# Patient Record
Sex: Female | Born: 1950
Health system: Southern US, Community
[De-identification: ages and names within clinical notes are randomized; demographics above are authoritative.]

## PROBLEM LIST (undated history)

## (undated) DIAGNOSIS — M5136 Other intervertebral disc degeneration, lumbar region: Secondary | ICD-10-CM

## (undated) DIAGNOSIS — M51369 Other intervertebral disc degeneration, lumbar region without mention of lumbar back pain or lower extremity pain: Secondary | ICD-10-CM

## (undated) DIAGNOSIS — T7840XA Allergy, unspecified, initial encounter: Secondary | ICD-10-CM

## (undated) HISTORY — DX: Other intervertebral disc degeneration, lumbar region: M51.36

## (undated) HISTORY — DX: Allergy, unspecified, initial encounter: T78.40XA

## (undated) HISTORY — PX: UTERINE FIBROID SURGERY: SHX826

## (undated) HISTORY — DX: Other intervertebral disc degeneration, lumbar region without mention of lumbar back pain or lower extremity pain: M51.369

---

## 2007-10-27 ENCOUNTER — Emergency Department (HOSPITAL_COMMUNITY): Admission: EM | Admit: 2007-10-27 | Discharge: 2007-10-27 | Payer: Self-pay | Admitting: Family Medicine

## 2008-10-14 ENCOUNTER — Emergency Department (HOSPITAL_COMMUNITY): Admission: EM | Admit: 2008-10-14 | Discharge: 2008-10-14 | Payer: Self-pay | Admitting: Emergency Medicine

## 2008-10-29 ENCOUNTER — Encounter: Payer: Self-pay | Admitting: Cardiology

## 2008-10-29 ENCOUNTER — Encounter (INDEPENDENT_AMBULATORY_CARE_PROVIDER_SITE_OTHER): Payer: Self-pay | Admitting: Diagnostic Neuroimaging

## 2008-10-29 ENCOUNTER — Ambulatory Visit: Payer: Self-pay

## 2010-05-23 LAB — POCT I-STAT, CHEM 8
Calcium, Ion: 1.24 mmol/L (ref 1.12–1.32)
Glucose, Bld: 90 mg/dL (ref 70–99)
HCT: 47 % — ABNORMAL HIGH (ref 36.0–46.0)
Hemoglobin: 16 g/dL — ABNORMAL HIGH (ref 12.0–15.0)
Potassium: 3.7 mEq/L (ref 3.5–5.1)

## 2010-11-18 LAB — GC/CHLAMYDIA PROBE AMP, GENITAL: Chlamydia, DNA Probe: NEGATIVE

## 2010-11-18 LAB — POCT URINALYSIS DIP (DEVICE)
Nitrite: NEGATIVE
Operator id: 247071
Protein, ur: NEGATIVE
pH: 5

## 2010-11-18 LAB — WET PREP, GENITAL: Yeast Wet Prep HPF POC: NONE SEEN

## 2010-11-18 LAB — URINE CULTURE

## 2015-06-16 DIAGNOSIS — Z0100 Encounter for examination of eyes and vision without abnormal findings: Secondary | ICD-10-CM | POA: Diagnosis not present

## 2015-07-10 ENCOUNTER — Ambulatory Visit (HOSPITAL_BASED_OUTPATIENT_CLINIC_OR_DEPARTMENT_OTHER)
Admission: RE | Admit: 2015-07-10 | Discharge: 2015-07-10 | Disposition: A | Discharge status: Home / Self Care | Payer: BLUE CROSS/BLUE SHIELD | Source: Ambulatory Visit | Attending: Physician Assistant | Admitting: Physician Assistant

## 2015-07-10 ENCOUNTER — Telehealth: Payer: Self-pay | Admitting: *Deleted

## 2015-07-10 ENCOUNTER — Ambulatory Visit (INDEPENDENT_AMBULATORY_CARE_PROVIDER_SITE_OTHER): Payer: BLUE CROSS/BLUE SHIELD | Admitting: Physician Assistant

## 2015-07-10 ENCOUNTER — Ambulatory Visit (INDEPENDENT_AMBULATORY_CARE_PROVIDER_SITE_OTHER): Payer: BLUE CROSS/BLUE SHIELD

## 2015-07-10 VITALS — BP 146/92 | HR 84 | Temp 97.7°F | Resp 18 | Ht 63.5 in | Wt 181.0 lb

## 2015-07-10 DIAGNOSIS — M5136 Other intervertebral disc degeneration, lumbar region: Secondary | ICD-10-CM | POA: Diagnosis not present

## 2015-07-10 DIAGNOSIS — R202 Paresthesia of skin: Secondary | ICD-10-CM | POA: Diagnosis not present

## 2015-07-10 DIAGNOSIS — M545 Low back pain: Secondary | ICD-10-CM

## 2015-07-10 DIAGNOSIS — M51369 Other intervertebral disc degeneration, lumbar region without mention of lumbar back pain or lower extremity pain: Secondary | ICD-10-CM

## 2015-07-10 HISTORY — DX: Other intervertebral disc degeneration, lumbar region without mention of lumbar back pain or lower extremity pain: M51.369

## 2015-07-10 HISTORY — DX: Other intervertebral disc degeneration, lumbar region: M51.36

## 2015-07-10 MED ORDER — MELOXICAM 15 MG PO TABS: 15.0000 mg | ORAL_TABLET | Freq: Every day | ORAL | Status: DC

## 2015-07-10 NOTE — Patient Instructions (Addendum)
You are to go over to [facility] now for you CT scan.  Address: [address], [place], [place] [address]  Phone: [phone]     IF you received an x-ray today, you will receive an invoice from [facility]. Please contact [facility] at [phone] with questions or concerns regarding your invoice.   IF you received labwork today, you will receive an invoice from [facility]. Please contact [facility] at [phone] with questions or concerns regarding your invoice.   Our billing staff will not be able to assist you with questions regarding bills from these companies.  You will be contacted with the lab results as soon as they are available. The fastest way to get your results is to activate your My Chart account. Instructions are located on the last page of this paperwork. If you have not heard from [place] regarding the results in 2 weeks, please contact this office.

## 2015-07-10 NOTE — Telephone Encounter (Signed)
Called patient and told her her results. Verbalized understanding.

## 2015-07-10 NOTE — Telephone Encounter (Signed)
CT Head results in EPIC.  Patient was let go and she was advised someone would call her.

## 2015-07-10 NOTE — Progress Notes (Signed)
Patient ID: Jennifer Hobbs, female     DOB: February 19, 1950, 65 y.o.    MRN: [id number]  PCP: No primary care provider on file.  Chief Complaint  Patient presents with  . Neck Pain    right side, radiates down leg, x 2 weeks    Subjective:    HPI  Presents for evaluation of RIGHT sided neck pain.  About 5 years ago, she developed pain on the RIGHT side of her neck and in the RIGHT arm, associated with numbness in the arm. Was in the process of having it checked out, had a CT scan and an MRI, and another study she doesn't recall the name of.  CT 10/14/2008: Findings: Ventricular size and CSF spaces within normal limits. No evidence for acute infarct, hemorrhage, or mass lesion. No extra- axial fluid collections or midline shift. Calvarium intact. No fluid in the sinuses visualized.  Note that acute ischemic changes may be occult on CT in the first 24-48 hours.  IMPRESSION:  1. No acute or focal cranial abnormality at this time - see above Discussion.  MRI 10/14/2008: Findings: There is no evidence for acute infarction, intracranial hemorrhage, mass lesion, hydrocephalus, or extra-axial fluid. There is no atrophy, or significant white matter disease. Mildly prominent perivascular spaces suggest chronic hypertension. Wide patency of the carotid and basilar arteries is established with flow void signal. There is no significant sinus or mastoid disease. Extracranial soft tissues grossly unremarkable. Pituitary and cerebellar tonsils within normal limits. Mild cervical spondylosis.  IMPRESSION: No acute intracranial findings. Specifically no evidence for stroke or mass which might result in left-sided numbness.   She returned for her follow-up visit but her appointment had been cancelled, and has not rescheduled. The pain resolved, and the only residual was a "lump right here on my neck" (points to the supraclavicular space), until 2 weeks ago.  She  called the neurology office to reschedule and was advised that due to the time lapse, she needed a new referral.  Her grandson, who is 13, stayed with her for spring break, and she fell asleep curled on on the couch with him. She noted her pain upon waking. Initially the pain was in the low back, which has now resolved.  She now has pain in the "buttocks." Yesterday she noticed the recurrent neck pan as well. Reports numbness on the RIGHT side of the body, face, arm and leg this morning. "That little tingling feeling." It resolved except for the RIGHT jaw, which still feels "weird and tight."  Took ibuprofen, which helped for a day or two. Then tried naproxen, which "did all right for a little while, it eases."   Prior to Admission medications   Not on File     No Known Allergies   There are no active problems to display for this patient.    Family History  Problem Relation Age of Onset  . Diabetes Sister   . Stroke Brother   . Multiple sclerosis Daughter 19     Social History   Social History  . Marital Status: Married    Spouse Name: Geralynn Demchak  . Number of Children: 1  . Years of Education: college   Occupational History  . 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 Smoking status: Former Research scientist (life sciences)  . Smokeless tobacco: Never Used  . Alcohol Use: 0.0 - 1.2 oz/week    0-2 Standard drinks or equivalent per  week  . Drug Use: No  . Sexual Activity: Not on file   Other Topics Concern  . Not on file   Social History Narrative   Graduated from the [employer].   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   Lives with her husband.   Their daughter and grandson moved to [place], [place] 09/2013.         Review of Systems As above.      Objective:  Physical Exam  Constitutional: She is oriented  to person, place, and time. She appears well-developed and well-nourished. She is active and cooperative. No distress.  BP 146/92 mmHg  Pulse 84  Temp(Src) 97.7 F (36.5 C) (Oral)  Resp 18  Ht 5' 3.5" (1.613 m)  Wt 181 lb (82.101 kg)  BMI 31.56 kg/m2  SpO2 98%  HENT:  Head: Normocephalic and atraumatic.  Right Ear: Hearing normal.  Left Ear: Hearing normal.  Eyes: Conjunctivae are normal. No scleral icterus.  Neck: Normal range of motion. Neck supple. No thyromegaly present.  Cardiovascular: Normal rate, regular rhythm and normal heart sounds.   Pulses:      Radial pulses are 2+ on the right side, and 2+ on the left side.  Pulmonary/Chest: Effort normal and breath sounds normal.  Lymphadenopathy:       Head (right side): No tonsillar, no preauricular, no posterior auricular and no occipital adenopathy present.       Head (left side): No tonsillar, no preauricular, no posterior auricular and no occipital adenopathy present.    She has no cervical adenopathy.       Right: No supraclavicular adenopathy present.       Left: No supraclavicular adenopathy present.  Neurological: She is alert and oriented to person, place, and time. She has normal strength. A sensory deficit (decreased sensation on the lower jaw on the RIGHT) is present. No cranial nerve deficit. Coordination and gait normal.  Reflex Scores:      Bicep reflexes are 2+ on the right side and 2+ on the left side.      Patellar reflexes are 2+ on the right side and 2+ on the left side.      Achilles reflexes are 2+ on the right side and 2+ on the left side. Skin: Skin is warm, dry and intact. No rash noted. No cyanosis or erythema. Nails show no clubbing.  Psychiatric: She has a normal mood and affect. Her speech is normal and behavior is normal.        Dg Lumbar Spine Complete  07/10/2015  CLINICAL DATA:  Low back pain for 2 weeks.  No injury. EXAM: LUMBAR SPINE - COMPLETE 4+ VIEW COMPARISON:  None. FINDINGS: There is  no evidence of lumbar spine fracture. Alignment is normal except for trace anterolisthesis L2-3 Advanced disc space narrowing at L3-4, L4-5, and L5-S1. Vascular calcification. No worrisome osseous lesion. Marked endplate spurring. Lower lumbar facet arthropathy. IMPRESSION: Degenerative changes as described. Electronically Signed   By: Staci Righter M.D.   On: 07/10/2015 14:58        Assessment & Plan:  1. Bilateral low back pain, with sciatica presence unspecified DDD, l-spine. Symptomatic care. - DG Lumbar Spine Complete; Future - meloxicam (MOBIC) 15 MG tablet; Take 1 tablet (15 mg total) by mouth daily.  Dispense: 30 tablet; Refill: 1  2. Paresthesia Head CT now due to facial paresthesias  acutely today. Refer back to neurology for additional evaluation. - Ambulatory referral to Neurology - CT Head Wo Contrast; Future  3. Degenerative disc disease, lumbar - meloxicam (MOBIC) 15 MG tablet; Take 1 tablet (15 mg total) by mouth daily.  Dispense: 30 tablet; Refill: 1   Fara Chute, PA-C Physician Assistant-Certified [facility]

## 2015-07-21 ENCOUNTER — Ambulatory Visit (INDEPENDENT_AMBULATORY_CARE_PROVIDER_SITE_OTHER): Payer: BLUE CROSS/BLUE SHIELD | Admitting: Diagnostic Neuroimaging

## 2015-07-21 ENCOUNTER — Encounter: Payer: Self-pay | Admitting: Diagnostic Neuroimaging

## 2015-07-21 VITALS — BP 179/97 | HR 78 | Ht 65.0 in | Wt 181.2 lb

## 2015-07-21 DIAGNOSIS — R202 Paresthesia of skin: Secondary | ICD-10-CM

## 2015-07-21 DIAGNOSIS — I10 Essential (primary) hypertension: Secondary | ICD-10-CM

## 2015-07-21 DIAGNOSIS — R200 Anesthesia of skin: Secondary | ICD-10-CM

## 2015-07-21 MED ORDER — ASPIRIN EC 81 MG PO TBEC: 81.0000 mg | DELAYED_RELEASE_TABLET | Freq: Every day | ORAL | Status: AC

## 2015-07-21 NOTE — Patient Instructions (Signed)
Thank you for coming to see us at [facility]. I hope we have been able to provide you high quality care today.  You may receive a patient satisfaction survey over the next few weeks. We would appreciate your feedback and comments so that we may continue to improve ourselves and the health of our patients.  - I will check MRI brain and MRA head - I will check ultrasound of neck and heart - I will check lab testing - start aspirin 81mg daily - monitor blood pressures at home - establish with primary care physician   ~~~~~~~~~~~~~~~~~~~~~~~~~~~~~~~~~~~~~~~~~~~~~~~~~~~~~~~~~~~~~~~~~  DR. PENUMALLI'S GUIDE TO HAPPY AND HEALTHY LIVING These are some of my general health and wellness recommendations. Some of them may apply to you better than others. Please use common sense as you try these suggestions and feel free to ask me any questions.   ACTIVITY/FITNESS Mental, social, emotional and physical stimulation are very important for brain and body health. Try learning a new activity (arts, music, language, sports, games).  Keep moving your body to the best of your abilities. You can do this at home, inside or outside, the park, community center, gym or anywhere you like. Consider a physical therapist or personal trainer to get started. Consider the app Sworkit. Fitness trackers such as smart-watches, smart-phones or Fitbits can help as well.   NUTRITION Eat more plants: colorful vegetables, nuts, seeds and berries.  Eat less sugar, salt, preservatives and processed foods.  Avoid toxins such as cigarettes and alcohol.  Drink water when you are thirsty. Warm water with a slice of lemon is an excellent morning drink to start the day.  Consider these websites for more information The Nutrition Source (https://www.hsph.harvard.edu/nutritionsource) Precision Nutrition (www.precisionnutrition.com/blog/infographics)   RELAXATION Consider practicing mindfulness meditation or  other relaxation techniques such as deep breathing, prayer, yoga, tai chi, massage. See website mindful.org or the apps Headspace or Calm to help get started.   SLEEP Try to get at least 7-8+ hours sleep per day. Regular exercise and reduced caffeine will help you sleep better. Practice good sleep hygeine techniques. See website sleep.org for more information.   PLANNING Prepare estate planning, living will, healthcare POA documents. Sometimes this is best planned with the help of an attorney. Theconversationproject.org and agingwithdignity.org are excellent resources.  

## 2015-07-21 NOTE — Progress Notes (Signed)
[facility]  PATIENT: Jennifer Hobbs DOB: April 10, 1950  REFERRING CLINICIAN: Doroteo Bradford, PA HISTORY FROM: patient  REASON FOR VISIT: new consult   HISTORICAL  CHIEF COMPLAINT:  Chief Complaint  Patient presents with  . Paresthesia    rm 6, New Pt, grandson- [name], "numbness R side of face and extremities, tingly, sometimes goes down my extremities"    HISTORY OF PRESENT ILLNESS:   65 year old right-handed female here for evaluation of transient right-sided numbness. 2010 patient had right arm and right leg numbness lasting for one day. She had MRI of the brain at that time which was unremarkable. She had no further recurrence of symptoms until 07/10/2015. Patient felt numbness in the right face, right arm, right leg. Symptoms have been fluctuating. Symptoms may occur for a few seconds and then go away. Sometimes symptoms will last for a few minutes at a time. She's been having multiple episodes on a daily basis. During our conversation and examination, patient felt some numbness in the right face for a few seconds which then stopped.  Patient denies any slurred speech, weakness, problems with the left side, headaches, vision problems or balance difficulty. No specific triggering or acting factors. No recent traumas, infections, injuries.  Patient does not have a primary care physician. Meloxicam as needed for joint pain. Today blood pressure is 179/97. She does not routinely check her blood pressure at home.   REVIEW OF SYSTEMS: Full 14 system review of systems performed and negative with exception of: Numbness aching muscles. Degenerative lumbar spine disease.   ALLERGIES: No Known Allergies  HOME MEDICATIONS: Outpatient Prescriptions Prior to Visit  Medication Sig Dispense Refill  . meloxicam (MOBIC) 15 MG tablet Take 1 tablet (15 mg total) by mouth daily. 30 tablet 1   No facility-administered medications prior to visit.    PAST MEDICAL  HISTORY: Past Medical History  Diagnosis Date  . DDD (degenerative disc disease), lumbar     PAST SURGICAL HISTORY: Past Surgical History  Procedure Laterality Date  . Uterine fibroid surgery      FAMILY HISTORY: Family History  Problem Relation Age of Onset  . Diabetes Sister   . Stroke Brother   . Multiple sclerosis Daughter 74    SOCIAL HISTORY:  Social History   Social History  . Marital Status: Married    Spouse Name: Shiya Kesselring  . Number of Children: 1  . Years of Education: college   Occupational History  . 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 Smoking status: Former Research scientist (life sciences)  . Smokeless tobacco: Never Used     Comment: cannot remember when she quit smoking  . Alcohol Use: 0.0 - 1.2 oz/week    0-2 Standard drinks or equivalent per week     Comment: socially  . Drug Use: No  . Sexual Activity: Not on file   Other Topics Concern  . Not on file   Social History Narrative   Graduated from the [employer].   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   Lives with her husband.   Their daughter and grandson moved to [place], [place] 09/2013.      PHYSICAL EXAM  GENERAL EXAM/CONSTITUTIONAL: Vitals:  Filed Vitals:   07/21/15 1046  BP: 179/97  Pulse: 78  Height: 5\' 5"  (1.651 m)  Weight: 181  lb 3.2 oz (82.192 kg)     Body mass index is 30.15 kg/(m^2).  Visual Acuity Screening   Right eye Left eye Both eyes  Without correction:     With correction: 20/30 20/30      Patient is in no distress; well developed, nourished and groomed; neck is supple  CARDIOVASCULAR:  Examination of carotid arteries is normal; no carotid bruits  Regular rate and rhythm, no murmurs  Examination of peripheral vascular system by observation and palpation is normal  EYES:  Ophthalmoscopic exam of  optic discs and posterior segments is normal; no papilledema or hemorrhages  MUSCULOSKELETAL:  Gait, strength, tone, movements noted in Neurologic exam below  NEUROLOGIC: MENTAL STATUS:  No flowsheet data found.  awake, alert, oriented to person, place and time  recent and remote memory intact  normal attention and concentration  language fluent, comprehension intact, naming intact,   fund of knowledge appropriate  CRANIAL NERVE:   2nd - no papilledema on fundoscopic exam  2nd, 3rd, 4th, 6th - pupils equal and reactive to light, visual fields full to confrontation, extraocular muscles intact, no nystagmus  5th - facial sensation symmetric  7th - facial strength symmetric  8th - hearing intact  9th - palate elevates symmetrically, uvula midline  11th - shoulder shrug symmetric  12th - tongue protrusion midline  MOTOR:   normal bulk and tone, full strength in the BUE, BLE  SENSORY:   normal and symmetric to light touch, temperature, vibration  COORDINATION:   finger-nose-finger, fine finger movements normal  REFLEXES:   deep tendon reflexes present and symmetric  GAIT/STATION:   narrow based gait; able to walk tandem; romberg is negative    DIAGNOSTIC DATA (LABS, IMAGING, TESTING) - I reviewed patient records, labs, notes, testing and imaging myself where available.  Lab Results  Component Value Date   HGB 16.0* 10/14/2008   HCT 47.0* 10/14/2008      Component Value Date/Time   NA 142 10/14/2008 1346   K 3.7 10/14/2008 1346   CL 108 10/14/2008 1346   GLUCOSE 90 10/14/2008 1346   BUN 16 10/14/2008 1346   CREATININE 0.9 10/14/2008 1346   No results found for: CHOL, HDL, LDLCALC, LDLDIRECT, TRIG, CHOLHDL No results found for: HGBA1C No results found for: VITAMINB12 No results found for: TSH  10/14/08 MRI brain [I reviewed images myself and agree with interpretation. -VRP]  - No acute intracranial findings. Specifically no evidence for  stroke or mass which might result in left-sided numbness.  07/10/15 CT head [I reviewed images myself and agree with interpretation. -VRP]  - No acute intracranial abnormality. No definite acute cortical infarction.    ASSESSMENT AND PLAN  65 y.o. year old female here with multiple episodes of transient right face, arm, leg numbness and tingling, since 07/10/2015. Neurologic examination is normal today.   Ddx: TIA, migraine variant, metabolic  1. Accelerated hypertension   2. Numbness and tingling of right side of face   3. Numbness and tingling of right arm and leg     PLAN: - I will check MRI brain and MRA head - I will check ultrasound of neck and heart - I will check lab testing - start aspirin 81mg  daily - monitor blood pressures at home - establish with primary care physician  Orders Placed This Encounter  Procedures  . MR Brain Wo Contrast  . MR MRA HEAD WO CONTRAST  . Hemoglobin A1c  . Lipid Panel  . Comprehensive metabolic panel  .  Vitamin B12  . TSH  . ECHOCARDIOGRAM COMPLETE   Meds ordered this encounter  Medications  . aspirin EC 81 MG tablet    Sig: Take 1 tablet (81 mg total) by mouth daily.   Return in about 6 weeks (around 09/01/2015).  I reviewed images, labs, notes, records myself. I summarized findings and reviewed with patient, for this high risk condition (acute numbness and neurologic symptoms, possible recurrent TIA) requiring high complexity decision making.     Penni Bombard, MD [id number], [id number] AM Certified in Neurology, Neurophysiology and Neuroimaging  [facility] [address] [place], [place] [address] [phone]

## 2015-07-22 ENCOUNTER — Telehealth: Payer: Self-pay | Admitting: *Deleted

## 2015-07-22 LAB — COMPREHENSIVE METABOLIC PANEL
ALK PHOS: 92 IU/L (ref 39–117)
ALT: 11 IU/L (ref 0–32)
AST: 9 IU/L (ref 0–40)
Albumin/Globulin Ratio: 1.8 (ref 1.2–2.2)
Albumin: 4.1 g/dL (ref 3.6–4.8)
BILIRUBIN TOTAL: 0.5 mg/dL (ref 0.0–1.2)
BUN/Creatinine Ratio: 21 (ref 12–28)
BUN: 16 mg/dL (ref 8–27)
CHLORIDE: 104 mmol/L (ref 96–106)
CO2: 25 mmol/L (ref 18–29)
Calcium: 9.9 mg/dL (ref 8.7–10.3)
Creatinine, Ser: 0.75 mg/dL (ref 0.57–1.00)
GFR calc Af Amer: 99 mL/min/{1.73_m2} (ref >59)
GFR calc non Af Amer: 86 mL/min/{1.73_m2} (ref >59)
GLUCOSE: 97 mg/dL (ref 65–99)
Globulin, Total: 2.3 g/dL (ref 1.5–4.5)
Potassium: 4.4 mmol/L (ref 3.5–5.2)
Sodium: 145 mmol/L — ABNORMAL HIGH (ref 134–144)
Total Protein: 6.4 g/dL (ref 6.0–8.5)

## 2015-07-22 LAB — LIPID PANEL
CHOL/HDL RATIO: 2.3 ratio (ref 0.0–4.4)
Cholesterol, Total: 221 mg/dL — ABNORMAL HIGH (ref 100–199)
HDL: 98 mg/dL (ref >39)
LDL Calculated: 102 mg/dL — ABNORMAL HIGH (ref 0–99)
Triglycerides: 103 mg/dL (ref 0–149)
VLDL CHOLESTEROL CAL: 21 mg/dL (ref 5–40)

## 2015-07-22 LAB — TSH: TSH: 1.7 u[IU]/mL (ref 0.450–4.500)

## 2015-07-22 LAB — HEMOGLOBIN A1C
Est. average glucose Bld gHb Est-mCnc: 123 mg/dL
HEMOGLOBIN A1C: 5.9 % — AB (ref 4.8–5.6)

## 2015-07-22 LAB — VITAMIN B12: VITAMIN B 12: 735 pg/mL (ref 211–946)

## 2015-07-22 NOTE — Telephone Encounter (Signed)
Spoke with patient and informed her , per Dr Leta Baptist, her thyroid and Vit B 12 labs are normal. However her hgb A1c and lipids/cholesterol are mildly elevated. Advised she needs to establish with a PCP to have these monitored for her future good health.  Advised she could establish with PCP her husband sees. She verbalized understanding, agreement.

## 2015-07-25 ENCOUNTER — Telehealth: Payer: Self-pay | Admitting: General Practice

## 2015-07-25 NOTE — Telephone Encounter (Signed)
Pt's husband, Ashleymarie Dinsmore, is a pt of yours and she is wondering if you can take her on as well Please advise

## 2015-07-31 NOTE — Telephone Encounter (Signed)
Ok with me, thanks.

## 2015-08-04 NOTE — Telephone Encounter (Signed)
Left msg on pts vm to call back to schedule a new patient appt with Dr. Jenny Reichmann

## 2015-08-15 ENCOUNTER — Other Ambulatory Visit (HOSPITAL_COMMUNITY): Payer: BLUE CROSS/BLUE SHIELD

## 2015-08-20 ENCOUNTER — Ambulatory Visit (HOSPITAL_COMMUNITY): Payer: BLUE CROSS/BLUE SHIELD | Attending: Cardiovascular Disease

## 2015-08-20 ENCOUNTER — Other Ambulatory Visit: Payer: Self-pay

## 2015-08-20 ENCOUNTER — Encounter (INDEPENDENT_AMBULATORY_CARE_PROVIDER_SITE_OTHER): Payer: Self-pay

## 2015-08-20 DIAGNOSIS — R200 Anesthesia of skin: Secondary | ICD-10-CM | POA: Diagnosis not present

## 2015-08-20 DIAGNOSIS — R202 Paresthesia of skin: Secondary | ICD-10-CM | POA: Diagnosis not present

## 2015-08-20 DIAGNOSIS — Z87891 Personal history of nicotine dependence: Secondary | ICD-10-CM | POA: Diagnosis not present

## 2015-08-20 DIAGNOSIS — G459 Transient cerebral ischemic attack, unspecified: Secondary | ICD-10-CM | POA: Diagnosis present

## 2015-08-20 DIAGNOSIS — I119 Hypertensive heart disease without heart failure: Secondary | ICD-10-CM | POA: Diagnosis not present

## 2015-08-20 DIAGNOSIS — I10 Essential (primary) hypertension: Secondary | ICD-10-CM

## 2015-08-21 ENCOUNTER — Encounter: Payer: Self-pay | Admitting: Internal Medicine

## 2015-08-21 ENCOUNTER — Ambulatory Visit (INDEPENDENT_AMBULATORY_CARE_PROVIDER_SITE_OTHER): Payer: BLUE CROSS/BLUE SHIELD | Admitting: Internal Medicine

## 2015-08-21 VITALS — BP 122/70 | HR 91 | Temp 98.7°F | Resp 20 | Wt 184.0 lb

## 2015-08-21 DIAGNOSIS — I5189 Other ill-defined heart diseases: Secondary | ICD-10-CM | POA: Insufficient documentation

## 2015-08-21 DIAGNOSIS — Z1211 Encounter for screening for malignant neoplasm of colon: Secondary | ICD-10-CM | POA: Diagnosis not present

## 2015-08-21 DIAGNOSIS — D179 Benign lipomatous neoplasm, unspecified: Secondary | ICD-10-CM

## 2015-08-21 DIAGNOSIS — R030 Elevated blood-pressure reading, without diagnosis of hypertension: Secondary | ICD-10-CM | POA: Diagnosis not present

## 2015-08-21 DIAGNOSIS — M545 Low back pain, unspecified: Secondary | ICD-10-CM

## 2015-08-21 DIAGNOSIS — R6889 Other general symptoms and signs: Secondary | ICD-10-CM

## 2015-08-21 DIAGNOSIS — Z0000 Encounter for general adult medical examination without abnormal findings: Secondary | ICD-10-CM | POA: Insufficient documentation

## 2015-08-21 DIAGNOSIS — Z0001 Encounter for general adult medical examination with abnormal findings: Secondary | ICD-10-CM | POA: Diagnosis not present

## 2015-08-21 DIAGNOSIS — E785 Hyperlipidemia, unspecified: Secondary | ICD-10-CM

## 2015-08-21 DIAGNOSIS — I519 Heart disease, unspecified: Secondary | ICD-10-CM | POA: Diagnosis not present

## 2015-08-21 DIAGNOSIS — Z1159 Encounter for screening for other viral diseases: Secondary | ICD-10-CM

## 2015-08-21 DIAGNOSIS — I10 Essential (primary) hypertension: Secondary | ICD-10-CM | POA: Insufficient documentation

## 2015-08-21 NOTE — Progress Notes (Signed)
Subjective:    Patient ID: Jennifer Hobbs, female    DOB: [birth year], 65 y.o.   MRN: [id number]  HPI  Here for wellness and establish as new pt;  Overall doing ok;  Pt denies Chest pain, worsening SOB, DOE, wheezing, orthopnea, PND, worsening LE edema, palpitations, dizziness or syncope.  Pt denies neurological change such as new headache, facial or extremity weakness.  Pt denies polydipsia, polyuria, or low sugar symptoms. Pt states overall good compliance with treatment and medications, good tolerability, and has been trying to follow appropriate diet.  Pt denies worsening depressive symptoms, suicidal ideation or panic. No fever, night sweats, wt loss, loss of appetite, or other constitutional symptoms.  Pt states good ability with ADL's, has low fall risk, home safety reviewed and adequate, no other significant changes in hearing or vision, and only occasionally active with exercise.   Has not been able to get to the gym 3 times per wk as usual in the last 2 wks due to onset LBP, now improved, plans to restart.  Wants to know if can take the mobic prn instead of scheduled.  Did also have elev BP twice with recent LBP, now both improved after seeing UC , then neurology. No hx of elev BP.  Echo resulted yesterday with diast dysfxn noted gr 2, EF normal, no LVH  Recent a1c normal, and LDL only very slightly elevated.  Does also have a lump to the right supraclavicular area, no change in size for over a yr  Past Medical History  Diagnosis Date  . DDD (degenerative disc disease), lumbar   . Degenerative disc disease, lumbar 07/10/2015   Past Surgical History  Procedure Laterality Date  . Uterine fibroid surgery      reports that she has quit smoking. She has never used smokeless tobacco. She reports that she drinks alcohol. She reports that she does not use illicit drugs. family history includes Diabetes in her sister; Multiple sclerosis (age of onset: 51) in her daughter; Stroke in her  brother. No Known Allergies Current Outpatient Prescriptions on File Prior to Visit  Medication Sig Dispense Refill  . aspirin EC 81 MG tablet Take 1 tablet (81 mg total) by mouth daily.    . meloxicam (MOBIC) 15 MG tablet Take 1 tablet (15 mg total) by mouth daily. 30 tablet 1   No current facility-administered medications on file prior to visit.     Review of Systems Constitutional: Negative for increased diaphoresis, or other activity, appetite or siginficant weight change other than noted HENT: Negative for worsening hearing loss, ear pain, facial swelling, mouth sores and neck stiffness.   Eyes: Negative for other worsening pain, redness or visual disturbance.  Respiratory: Negative for choking or stridor Cardiovascular: Negative for other chest pain and palpitations.  Gastrointestinal: Negative for worsening diarrhea, blood in stool, or abdominal distention Genitourinary: Negative for hematuria, flank pain or change in urine volume.  Musculoskeletal: Negative for myalgias or other joint complaints.  Skin: Negative for other color change and wound or drainage.  Neurological: Negative for syncope and numbness. other than noted Hematological: Negative for adenopathy. or other swelling Psychiatric/Behavioral: Negative for hallucinations, SI, self-injury, decreased concentration or other worsening agitation.      Objective:   Physical Exam BP 122/70 mmHg  Pulse 91  Temp(Src) 98.7 F (37.1 C) (Oral)  Resp 20  Wt 184 lb (83.462 kg)  SpO2 94% VS noted,  Constitutional: Pt is oriented to person, place, and time. Appears well-developed and  well-nourished, in no significant distress Head: Normocephalic and atraumatic  Eyes: Conjunctivae and EOM are normal. Pupils are equal, round, and reactive to light Right Ear: External ear normal.  Left Ear: External ear normal Nose: Nose normal.  Mouth/Throat: Oropharynx is clear and moist  Neck: Normal range of motion. Neck supple. No JVD  present. No tracheal deviation present or significant neck LA or mass Cardiovascular: Normal rate, regular rhythm, normal heart sounds and intact distal pulses.   Pulmonary/Chest: Effort normal and breath sounds without rales or wheezing  Abdominal: Soft. Bowel sounds are normal. NT. No HSM  Musculoskeletal: Normal range of motion. Exhibits no edema Lymphadenopathy: Has no cervical adenopathy.  Neurological: Pt is alert and oriented to person, place, and time. Pt has normal reflexes. No cranial nerve deficit. Motor grossly intact Skin: Skin is warm and dry. No rash noted or new ulcers, + lipioma mass approx 4 cm to right supraclavicular, Psychiatric:  Has normal mood and affect. Behavior is normal.   Lab Results  Component Value Date   HGB 16.0* 10/14/2008   HCT 47.0* 10/14/2008   GLUCOSE 97 07/21/2015   CHOL 221* 07/21/2015   TRIG 103 07/21/2015   HDL 98 07/21/2015   LDLCALC 102* 07/21/2015   ALT 11 07/21/2015   AST 9 07/21/2015   NA 145* 07/21/2015   K 4.4 07/21/2015   CL 104 07/21/2015   CREATININE 0.75 07/21/2015   BUN 16 07/21/2015   CO2 25 07/21/2015   TSH 1.700 07/21/2015   HGBA1C 5.9* 07/21/2015       Assessment & Plan:

## 2015-08-21 NOTE — Assessment & Plan Note (Signed)
Right supraclavicular, stable size per pt,  to f/u any worsening symptoms or concerns

## 2015-08-21 NOTE — Progress Notes (Signed)
Pre visit review using our clinic review tool, if applicable. No additional management support is needed unless otherwise documented below in the visit note. 

## 2015-08-21 NOTE — Patient Instructions (Addendum)
You can take the meloxicam as needed only, for the lower back pain  Please continue all other medications as before, and refills have been done if requested.  Please have the pharmacy call with any other refills you may need.  Please continue to monitor your Blood Pressure on a regular basis, with the goal being to average less than 140/.90  Please continue your efforts at being more active, low cholesterol diet, and weight control.  You are otherwise up to date with prevention measures today.  Please keep your appointments with your specialists as you may have planned  You will be contacted regarding the referral for: GYN, and colonoscopy  No further lab work is needed today  Please remember to sign up for MyChart if you have not done so, as this will be important to you in the future with finding out test results, communicating by private email, and scheduling acute appointments online when needed.  Please return in 1 year for your yearly visit, or sooner if needed, with Lab testing done 3-5 days before

## 2015-08-21 NOTE — Assessment & Plan Note (Signed)
On echo yesterday, asympt, no volume increase on exam, ok to follow

## 2015-08-24 NOTE — Assessment & Plan Note (Addendum)
Improved recently, ok for mobic prn instead of scheduled

## 2015-08-24 NOTE — Assessment & Plan Note (Addendum)
Likely situational, now improved,  to f/u any worsening symptoms or concerns  In addition to the time spent performing CPE, I spent an additional 15 minutes face to face,in which greater than 50% of this time was spent in counseling and coordination of care for patient's illness as documented.

## 2015-08-24 NOTE — Assessment & Plan Note (Signed)

## 2015-08-24 NOTE — Assessment & Plan Note (Signed)
Lab Results  Component Value Date   LDLCALC 102* 07/21/2015   stable overall by history and exam, recent data reviewed with pt, and pt to continue medical treatment as before,  to f/u any worsening symptoms or concerns

## 2015-09-02 ENCOUNTER — Encounter: Payer: Self-pay | Admitting: Gastroenterology

## 2015-09-03 ENCOUNTER — Other Ambulatory Visit: Payer: Self-pay | Admitting: *Deleted

## 2015-09-03 ENCOUNTER — Ambulatory Visit: Payer: BLUE CROSS/BLUE SHIELD | Admitting: Diagnostic Neuroimaging

## 2015-09-03 DIAGNOSIS — M5136 Other intervertebral disc degeneration, lumbar region: Secondary | ICD-10-CM

## 2015-09-03 DIAGNOSIS — M545 Low back pain: Secondary | ICD-10-CM

## 2015-09-03 MED ORDER — MELOXICAM 15 MG PO TABS: 15.0000 mg | ORAL_TABLET | Freq: Every day | ORAL | Status: DC

## 2015-09-03 NOTE — Telephone Encounter (Signed)
rec'd call pt requesting refill on her Meloxicam. [facility]

## 2015-09-09 ENCOUNTER — Telehealth: Payer: Self-pay | Admitting: Internal Medicine

## 2015-09-09 NOTE — Telephone Encounter (Signed)
Please advise 

## 2015-09-11 ENCOUNTER — Telehealth: Payer: Self-pay | Admitting: Diagnostic Neuroimaging

## 2015-09-11 NOTE — Telephone Encounter (Signed)
Patient called regarding scheduling MRI/MRA. Please call [phone].

## 2015-09-15 DIAGNOSIS — Z6831 Body mass index (BMI) 31.0-31.9, adult: Secondary | ICD-10-CM | POA: Diagnosis not present

## 2015-09-15 DIAGNOSIS — Z1231 Encounter for screening mammogram for malignant neoplasm of breast: Secondary | ICD-10-CM | POA: Diagnosis not present

## 2015-09-15 DIAGNOSIS — Z01419 Encounter for gynecological examination (general) (routine) without abnormal findings: Secondary | ICD-10-CM | POA: Diagnosis not present

## 2015-09-15 DIAGNOSIS — Z1151 Encounter for screening for human papillomavirus (HPV): Secondary | ICD-10-CM | POA: Diagnosis not present

## 2015-09-17 ENCOUNTER — Ambulatory Visit (INDEPENDENT_AMBULATORY_CARE_PROVIDER_SITE_OTHER): Payer: BLUE CROSS/BLUE SHIELD

## 2015-09-17 ENCOUNTER — Ambulatory Visit: Payer: BLUE CROSS/BLUE SHIELD | Admitting: Diagnostic Neuroimaging

## 2015-09-17 DIAGNOSIS — I10 Essential (primary) hypertension: Secondary | ICD-10-CM

## 2015-09-17 DIAGNOSIS — R202 Paresthesia of skin: Secondary | ICD-10-CM | POA: Diagnosis not present

## 2015-09-17 DIAGNOSIS — R200 Anesthesia of skin: Secondary | ICD-10-CM | POA: Diagnosis not present

## 2015-09-19 ENCOUNTER — Telehealth: Payer: Self-pay | Admitting: *Deleted

## 2015-09-19 NOTE — Telephone Encounter (Signed)
Per Dr Leta Baptist, LVM informing patient her MRI results are unremarkable, no change in current treatment plan. Advised she continue to monitor BP at home and take ASA 81 mg daily as instructed by Dr Leta Baptist. Left name, number for any questions.

## 2015-09-25 NOTE — Telephone Encounter (Signed)
LVM requesting call back to discuss her MRI results.

## 2015-09-25 NOTE — Telephone Encounter (Signed)
Patient returned [facility] call. Please call [phone].

## 2015-09-25 NOTE — Telephone Encounter (Signed)
Returned patient's call. She inquired about her MRI results; repeated the VM left earlier and advised she now needs a follow up scheduled. She stated she is no longer having the symptoms she was having when she came in. Offered to schedule follow up or to see her again on as needed basis. She stated she would like to call if she experiences symptoms again. Advised she call at any time, and we will be glad to see her again. She verbalized understanding, appreciation.

## 2015-09-25 NOTE — Telephone Encounter (Signed)
Patient is calling to discuss her MRI further.

## 2015-11-13 ENCOUNTER — Encounter: Payer: BLUE CROSS/BLUE SHIELD | Admitting: Gastroenterology

## 2015-12-16 ENCOUNTER — Ambulatory Visit (AMBULATORY_SURGERY_CENTER): Payer: Self-pay | Admitting: *Deleted

## 2015-12-16 ENCOUNTER — Encounter: Payer: Self-pay | Admitting: Gastroenterology

## 2015-12-16 VITALS — Ht 66.0 in | Wt 184.0 lb

## 2015-12-16 DIAGNOSIS — Z1211 Encounter for screening for malignant neoplasm of colon: Secondary | ICD-10-CM

## 2015-12-16 MED ORDER — NA SULFATE-K SULFATE-MG SULF 17.5-3.13-1.6 GM/177ML PO SOLN: 1.0000 | Freq: Once | ORAL | 0 refills | Status: AC

## 2015-12-16 NOTE — Progress Notes (Signed)
No egg or soy allergy known to patient  No issues with past sedation with any surgeries  or procedures, no intubation problems  No diet pills per patient No home 02 use per patient  No blood thinners per patient  Pt denies issues with constipation  No A fib or A flutter   emmi declined'   

## 2015-12-30 ENCOUNTER — Encounter: Payer: BLUE CROSS/BLUE SHIELD | Admitting: Gastroenterology

## 2016-01-20 ENCOUNTER — Telehealth: Payer: Self-pay | Admitting: Internal Medicine

## 2016-01-20 DIAGNOSIS — M5136 Other intervertebral disc degeneration, lumbar region: Secondary | ICD-10-CM

## 2016-01-20 MED ORDER — MELOXICAM 15 MG PO TABS: 15.0000 mg | ORAL_TABLET | Freq: Every day | ORAL | 3 refills | Status: DC

## 2016-01-20 NOTE — Telephone Encounter (Signed)
Refill sent.

## 2016-01-20 NOTE — Telephone Encounter (Signed)
Patient is requesting [facility] [address]

## 2016-02-05 ENCOUNTER — Encounter: Payer: Self-pay | Admitting: Gastroenterology

## 2016-02-18 ENCOUNTER — Ambulatory Visit: Payer: BLUE CROSS/BLUE SHIELD | Admitting: *Deleted

## 2016-02-18 VITALS — Ht 65.0 in | Wt 183.8 lb

## 2016-02-18 DIAGNOSIS — Z1211 Encounter for screening for malignant neoplasm of colon: Secondary | ICD-10-CM

## 2016-02-18 NOTE — Progress Notes (Signed)
Denies allergies to eggs or soy products. Denies complications with sedation or anesthesia. Denies O2 use. Denies use of diet or weight loss medications.  Emmi instructions declined for colonoscopy.  

## 2016-02-25 ENCOUNTER — Encounter: Payer: Self-pay | Admitting: Gastroenterology

## 2016-02-25 ENCOUNTER — Ambulatory Visit (AMBULATORY_SURGERY_CENTER): Payer: BLUE CROSS/BLUE SHIELD | Admitting: Gastroenterology

## 2016-02-25 VITALS — BP 177/96 | HR 75 | Temp 97.5°F | Resp 16 | Ht 65.0 in | Wt 183.0 lb

## 2016-02-25 DIAGNOSIS — Z1211 Encounter for screening for malignant neoplasm of colon: Secondary | ICD-10-CM | POA: Diagnosis not present

## 2016-02-25 DIAGNOSIS — K635 Polyp of colon: Secondary | ICD-10-CM | POA: Diagnosis not present

## 2016-02-25 DIAGNOSIS — D127 Benign neoplasm of rectosigmoid junction: Secondary | ICD-10-CM

## 2016-02-25 DIAGNOSIS — Z1212 Encounter for screening for malignant neoplasm of rectum: Secondary | ICD-10-CM

## 2016-02-25 DIAGNOSIS — D126 Benign neoplasm of colon, unspecified: Secondary | ICD-10-CM | POA: Diagnosis not present

## 2016-02-25 DIAGNOSIS — D120 Benign neoplasm of cecum: Secondary | ICD-10-CM | POA: Diagnosis not present

## 2016-02-25 MED ORDER — SODIUM CHLORIDE 0.9 % IV SOLN: 500.0000 mL | INTRAVENOUS | Status: DC

## 2016-02-25 NOTE — Progress Notes (Signed)
No problems noted in the recovery room. maw 

## 2016-02-25 NOTE — Progress Notes (Signed)
A and O x3. Report to RN. Tolerated MAC anesthesia well.

## 2016-02-25 NOTE — Progress Notes (Signed)
Called to room to assist during endoscopic procedure.  Patient ID and intended procedure confirmed with present staff. Received instructions for my participation in the procedure from the performing physician.  

## 2016-02-25 NOTE — Patient Instructions (Signed)
YOU HAD AN ENDOSCOPIC PROCEDURE TODAY AT [facility]:   Refer to the procedure report that was given to you for any specific questions about what was found during the examination.  If the procedure report does not answer your questions, please call your gastroenterologist to clarify.  If you requested that your care partner not be given the details of your procedure findings, then the procedure report has been included in a sealed envelope for you to review at your convenience later.  YOU SHOULD EXPECT: Some feelings of bloating in the abdomen. Passage of more gas than usual.  Walking can help get rid of the air that was put into your GI tract during the procedure and reduce the bloating. If you had a lower endoscopy (such as a colonoscopy or flexible sigmoidoscopy) you may notice spotting of blood in your stool or on the toilet paper. If you underwent a bowel prep for your procedure, you may not have a normal bowel movement for a few days.  Please Note:  You might notice some irritation and congestion in your nose or some drainage.  This is from the oxygen used during your procedure.  There is no need for concern and it should clear up in a day or so.  SYMPTOMS TO REPORT IMMEDIATELY:   Following lower endoscopy (colonoscopy or flexible sigmoidoscopy):  Excessive amounts of blood in the stool  Significant tenderness or worsening of abdominal pains  Swelling of the abdomen that is new, acute  Fever of 100F or higher   Following upper endoscopy (EGD)  Vomiting of blood or coffee ground material  New chest pain or pain under the shoulder blades  Painful or persistently difficult swallowing  New shortness of breath  Fever of 100F or higher  Black, tarry-looking stools  For urgent or emergent issues, a gastroenterologist can be reached at any hour by calling [phone].   DIET:  We do recommend a small meal at first, but then you may proceed to your regular diet.  Drink  plenty of fluids but you should avoid alcoholic beverages for 24 hours.  ACTIVITY:  You should plan to take it easy for the rest of today and you should NOT DRIVE or use heavy machinery until tomorrow (because of the sedation medicines used during the test).    FOLLOW UP: Our staff will call the number listed on your records the next business day following your procedure to check on you and address any questions or concerns that you may have regarding the information given to you following your procedure. If we do not reach you, we will leave a message.  However, if you are feeling well and you are not experiencing any problems, there is no need to return our call.  We will assume that you have returned to your regular daily activities without incident.  If any biopsies were taken you will be contacted by phone or by letter within the next 1-3 weeks.  Please call us at [phone] if you have not heard about the biopsies in 3 weeks.    SIGNATURES/CONFIDENTIALITY: You and/or your care partner have signed paperwork which will be entered into your electronic medical record.  These signatures attest to the fact that that the information above on your After Visit Summary has been reviewed and is understood.  Full responsibility of the confidentiality of this discharge information lies with you and/or your care-partner.   Handouts were given to your care partner on polyps, diverticulosis,  and hemorrhoids. No aspirin, aspirin products,  ibuprofen, naproxen, advil, motrin, aleve, or other non-steroidal anti-inflammatory drugs for 14 days after polyp removal. You may resume your other current medications today. Await biopsy results. Please call if any questions or concerns.

## 2016-02-25 NOTE — Op Note (Signed)
[place] Patient Name: Jennifer Hobbs Procedure Date: 02/25/2016 10:40 AM MRN: [id number] Endoscopist: Remo Lipps P. Yerick Eggebrecht MD, MD Age: 66 Referring MD:  Date of Birth: 09-Aug-1950 Gender: Female Account #: [id number] Procedure:                Colonoscopy Indications:              Screening for malignant neoplasm in the colon, This                            is the patient's first colonoscopy Medicines:                Monitored Anesthesia Care Procedure:                Pre-Anesthesia Assessment:                           - Prior to the procedure, a History and Physical                            was performed, and patient medications and                            allergies were reviewed. The patient's tolerance of                            previous anesthesia was also reviewed. The risks                            and benefits of the procedure and the sedation                            options and risks were discussed with the patient.                            All questions were answered, and informed consent                            was obtained. Prior Anticoagulants: The patient has                            taken aspirin, last dose was 1 day prior to                            procedure. ASA Grade Assessment: II - A patient                            with mild systemic disease. After reviewing the                            risks and benefits, the patient was deemed in                            satisfactory condition to undergo the procedure.  After obtaining informed consent, the colonoscope                            was passed under direct vision. Throughout the                            procedure, the patient's blood pressure, pulse, and                            oxygen saturations were monitored continuously. The                            Model PCF-H190DL [id number]) scope was introduced                            through the  anus and advanced to the the cecum,                            identified by appendiceal orifice and ileocecal                            valve. The colonoscopy was performed without                            difficulty. The patient tolerated the procedure                            well. The quality of the bowel preparation was                            good. The ileocecal valve, appendiceal orifice, and                            rectum were photographed. Scope In: 10:51:11 AM Scope Out: 11:08:45 AM Scope Withdrawal Time: 0 hours 16 minutes 9 seconds  Total Procedure Duration: 0 hours 17 minutes 34 seconds  Findings:                 The perianal and digital rectal examinations were                            normal.                           A 5 to 6 mm polyp was found in the ileocecal valve.                            The polyp was sessile. The polyp was removed with a                            cold snare. Resection and retrieval were complete.                           A 5 mm polyp was found in the recto-sigmoid colon.  The polyp was sessile. The polyp was removed with a                            cold snare. Resection and retrieval were complete.                           A few small-mouthed diverticula were found in the                            ascending colon.                           Internal hemorrhoids were found during retroflexion.                           The exam was otherwise without abnormality. Complications:            No immediate complications. Estimated blood loss:                            Minimal. Estimated Blood Loss:     Estimated blood loss was minimal. Impression:               - One 5 to 6 mm polyp at the ileocecal valve,                            removed with a cold snare. Resected and retrieved.                           - One 5 mm polyp at the recto-sigmoid colon,                            removed with a cold snare.  Resected and retrieved.                           - Diverticulosis in the ascending colon.                           - Internal hemorrhoids.                           - The examination was otherwise normal. Recommendation:           - Patient has a contact number available for                            emergencies. The signs and symptoms of potential                            delayed complications were discussed with the                            patient. Return to normal activities tomorrow.  Written discharge instructions were provided to the                            patient.                           - Resume previous diet.                           - Continue present medications.                           - No ibuprofen, naproxen, or other non-steroidal                            anti-inflammatory drugs for 2 weeks after polyp                            removal.                           - Await pathology results.                           - Repeat colonoscopy is recommended for                            surveillance. The colonoscopy date will be                            determined after pathology results from today's                            exam become available for review. Remo Lipps P. Brasen Bundren MD, MD 02/25/2016 11:12:45 AM This report has been signed electronically.

## 2016-02-26 ENCOUNTER — Telehealth: Payer: Self-pay

## 2016-02-26 ENCOUNTER — Telehealth: Payer: Self-pay | Admitting: *Deleted

## 2016-02-26 NOTE — Telephone Encounter (Signed)
  Follow up Call-  Call back number 02/25/2016  Post procedure Call Back phone  # [phone]  Permission to leave phone message Yes  Some recent data might be hidden     Patient expressed her gratitude for the care that she received in the [place].   Patient questions:  Do you have a fever, pain , or abdominal swelling? No. Pain Score  0 *  Have you tolerated food without any problems? Yes.    Have you been able to return to your normal activities? Yes.    Do you have any questions about your discharge instructions: Diet   No. Medications  No. Follow up visit  No.  Do you have questions or concerns about your Care? No.  Actions: * If pain score is 4 or above: No action needed, pain <4.

## 2016-02-26 NOTE — Telephone Encounter (Signed)
No answer, message left for the patient. 

## 2016-03-02 ENCOUNTER — Encounter: Payer: Self-pay | Admitting: Gastroenterology

## 2016-10-28 DIAGNOSIS — Z01419 Encounter for gynecological examination (general) (routine) without abnormal findings: Secondary | ICD-10-CM | POA: Diagnosis not present

## 2016-10-28 DIAGNOSIS — Z6830 Body mass index (BMI) 30.0-30.9, adult: Secondary | ICD-10-CM | POA: Diagnosis not present

## 2016-10-28 DIAGNOSIS — Z1231 Encounter for screening mammogram for malignant neoplasm of breast: Secondary | ICD-10-CM | POA: Diagnosis not present

## 2016-10-28 DIAGNOSIS — Z1151 Encounter for screening for human papillomavirus (HPV): Secondary | ICD-10-CM | POA: Diagnosis not present

## 2016-11-09 ENCOUNTER — Ambulatory Visit (INDEPENDENT_AMBULATORY_CARE_PROVIDER_SITE_OTHER): Payer: BLUE CROSS/BLUE SHIELD | Admitting: Internal Medicine

## 2016-11-09 ENCOUNTER — Other Ambulatory Visit (INDEPENDENT_AMBULATORY_CARE_PROVIDER_SITE_OTHER): Payer: BLUE CROSS/BLUE SHIELD

## 2016-11-09 ENCOUNTER — Encounter: Payer: Self-pay | Admitting: Internal Medicine

## 2016-11-09 VITALS — BP 178/100 | HR 62 | Temp 97.7°F | Ht 65.0 in | Wt 177.0 lb

## 2016-11-09 DIAGNOSIS — Z1159 Encounter for screening for other viral diseases: Secondary | ICD-10-CM

## 2016-11-09 DIAGNOSIS — Z114 Encounter for screening for human immunodeficiency virus [HIV]: Secondary | ICD-10-CM

## 2016-11-09 DIAGNOSIS — Z0000 Encounter for general adult medical examination without abnormal findings: Secondary | ICD-10-CM | POA: Diagnosis not present

## 2016-11-09 LAB — LIPID PANEL
CHOLESTEROL: 232 mg/dL — AB (ref 0–200)
HDL: 89 mg/dL (ref >39.00)
LDL Cholesterol: 116 mg/dL — ABNORMAL HIGH (ref 0–99)
NonHDL: 142.97
Total CHOL/HDL Ratio: 3
Triglycerides: 133 mg/dL (ref 0.0–149.0)
VLDL: 26.6 mg/dL (ref 0.0–40.0)

## 2016-11-09 LAB — HEPATIC FUNCTION PANEL
ALBUMIN: 4.2 g/dL (ref 3.5–5.2)
ALK PHOS: 89 U/L (ref 39–117)
ALT: 17 U/L (ref 0–35)
AST: 17 U/L (ref 0–37)
Bilirubin, Direct: 0.1 mg/dL (ref 0.0–0.3)
TOTAL PROTEIN: 7.2 g/dL (ref 6.0–8.3)
Total Bilirubin: 0.5 mg/dL (ref 0.2–1.2)

## 2016-11-09 LAB — URINALYSIS, ROUTINE W REFLEX MICROSCOPIC
Bilirubin Urine: NEGATIVE
Ketones, ur: NEGATIVE
Leukocytes, UA: NEGATIVE
Nitrite: NEGATIVE
Total Protein, Urine: NEGATIVE
URINE GLUCOSE: NEGATIVE
Urobilinogen, UA: 0.2 (ref 0.0–1.0)
pH: 5.5 (ref 5.0–8.0)

## 2016-11-09 LAB — BASIC METABOLIC PANEL
BUN: 16 mg/dL (ref 6–23)
CALCIUM: 9.9 mg/dL (ref 8.4–10.5)
CO2: 31 meq/L (ref 19–32)
Chloride: 104 mEq/L (ref 96–112)
Creatinine, Ser: 0.8 mg/dL (ref 0.40–1.20)
GFR: 92.89 mL/min (ref >60.00)
GLUCOSE: 108 mg/dL — AB (ref 70–99)
Potassium: 3.9 mEq/L (ref 3.5–5.1)
SODIUM: 142 meq/L (ref 135–145)

## 2016-11-09 LAB — CBC WITH DIFFERENTIAL/PLATELET
BASOS ABS: 0.1 10*3/uL (ref 0.0–0.1)
Basophils Relative: 0.9 % (ref 0.0–3.0)
EOS ABS: 0.1 10*3/uL (ref 0.0–0.7)
Eosinophils Relative: 1.3 % (ref 0.0–5.0)
HCT: 44.6 % (ref 36.0–46.0)
Hemoglobin: 14.2 g/dL (ref 12.0–15.0)
LYMPHS ABS: 3.1 10*3/uL (ref 0.7–4.0)
Lymphocytes Relative: 32.6 % (ref 12.0–46.0)
MCHC: 31.9 g/dL (ref 30.0–36.0)
MCV: 82.7 fl (ref 78.0–100.0)
MONO ABS: 0.6 10*3/uL (ref 0.1–1.0)
MONOS PCT: 5.7 % (ref 3.0–12.0)
Neutro Abs: 5.7 10*3/uL (ref 1.4–7.7)
Neutrophils Relative %: 59.5 % (ref 43.0–77.0)
Platelets: 297 10*3/uL (ref 150.0–400.0)
RBC: 5.38 Mil/uL — AB (ref 3.87–5.11)
RDW: 15.1 % (ref 11.5–15.5)
WBC: 9.7 10*3/uL (ref 4.0–10.5)

## 2016-11-09 LAB — TSH: TSH: 1.34 u[IU]/mL (ref 0.35–4.50)

## 2016-11-09 MED ORDER — MELOXICAM 15 MG PO TABS: 15.0000 mg | ORAL_TABLET | Freq: Every day | ORAL | 3 refills | Status: DC

## 2016-11-09 NOTE — Progress Notes (Signed)
Subjective:    Patient ID: Jennifer Hobbs, female    DOB: 1950/02/20, 66 y.o.   MRN: [id number]  HPI  Here for wellness and f/u;  Overall doing ok;  Pt denies Chest pain, worsening SOB, DOE, wheezing, orthopnea, PND, worsening LE edema, palpitations, dizziness or syncope.  Pt denies neurological change such as new headache, facial or extremity weakness.  Pt denies polydipsia, polyuria, or low sugar symptoms. Pt states overall good compliance with treatment and medications, good tolerability, and has been trying to follow appropriate diet.  Pt denies worsening depressive symptoms, suicidal ideation or panic. No fever, night sweats, wt loss, loss of appetite, or other constitutional symptoms.  Pt states good ability with ADL's, has low fall risk, home safety reviewed and adequate, no other significant changes in hearing or vision, and only occasionally active with exercise. Admits to too much starches in her diet, plans to try to cut back Wt Readings from Last 3 Encounters:  11/09/16 177 lb (80.3 kg)  02/25/16 183 lb (83 kg)  02/18/16 183 lb 12.8 oz (83.4 kg)  Declines immunizations.  BP usually< 140/90. Daughter has MS with recent flare, more stress in last few days. Older sister died recently after a fall, spine fx.   Past Medical History:  Diagnosis Date  . Allergy   . DDD (degenerative disc disease), lumbar   . Degenerative disc disease, lumbar 07/10/2015   Past Surgical History:  Procedure Laterality Date  . UTERINE FIBROID SURGERY      reports that she quit smoking about 7 years ago. She has never used smokeless tobacco. She reports that she drinks alcohol. She reports that she does not use drugs. family history includes Diabetes in her sister; Multiple sclerosis (age of onset: 54) in her daughter; Stroke in her brother. No Known Allergies Current Outpatient Prescriptions on File Prior to Visit  Medication Sig Dispense Refill  . aspirin EC 81 MG tablet Take 1 tablet (81 mg total) by  mouth daily.     Current Facility-Administered Medications on File Prior to Visit  Medication Dose Route Frequency Provider Last Rate Last Dose  . 0.9 %  sodium chloride infusion  500 mL Intravenous Continuous Armbruster, Carlota Raspberry, MD        Review of Systems Constitutional: Negative for other unusual diaphoresis, sweats, appetite or weight changes HENT: Negative for other worsening hearing loss, ear pain, facial swelling, mouth sores or neck stiffness.   Eyes: Negative for other worsening pain, redness or other visual disturbance.  Respiratory: Negative for other stridor or swelling Cardiovascular: Negative for other palpitations or other chest pain  Gastrointestinal: Negative for worsening diarrhea or loose stools, blood in stool, distention or other pain Genitourinary: Negative for hematuria, flank pain or other change in urine volume.  Musculoskeletal: Negative for myalgias or other joint swelling.  Skin: Negative for other color change, or other wound or worsening drainage.  Neurological: Negative for other syncope or numbness. Hematological: Negative for other adenopathy or swelling Psychiatric/Behavioral: Negative for hallucinations, other worsening agitation, SI, self-injury, or new decreased concentration All other system neg per pt    Objective:   Physical Exam BP (!) 178/100   Pulse 62   Temp 97.7 F (36.5 C) (Oral)   Ht 5\' 5"  (1.651 m)   Wt 177 lb (80.3 kg)   SpO2 97%   BMI 29.45 kg/m  VS noted,  Constitutional: Pt is oriented to person, place, and time. Appears well-developed and well-nourished, in no significant distress and  comfortable Head: Normocephalic and atraumatic  Eyes: Conjunctivae and EOM are normal. Pupils are equal, round, and reactive to light Right Ear: External ear normal without discharge Left Ear: External ear normal without discharge Nose: Nose without discharge or deformity Mouth/Throat: Oropharynx is without other ulcerations and moist  Neck:  Normal range of motion. Neck supple. No JVD present. No tracheal deviation present or significant neck LA or mass Cardiovascular: Normal rate, regular rhythm, normal heart sounds and intact distal pulses.   Pulmonary/Chest: WOB normal and breath sounds without rales or wheezing  Abdominal: Soft. Bowel sounds are normal. NT. No HSM  Musculoskeletal: Normal range of motion. Exhibits no edema Lymphadenopathy: Has no other cervical adenopathy.  Neurological: Pt is alert and oriented to person, place, and time. Pt has normal reflexes. No cranial nerve deficit. Motor grossly intact, Gait intact Skin: Skin is warm and dry. No rash noted or new ulcerations Psychiatric:  Has normal mood and affect. Behavior is normal without agitation No other exam findings Lab Results  Component Value Date   HGB 16.0 (H) 10/14/2008   HCT 47.0 (H) 10/14/2008   GLUCOSE 97 07/21/2015   CHOL 221 (H) 07/21/2015   TRIG 103 07/21/2015   HDL 98 07/21/2015   LDLCALC 102 (H) 07/21/2015   ALT 11 07/21/2015   AST 9 07/21/2015   NA 145 (H) 07/21/2015   K 4.4 07/21/2015   CL 104 07/21/2015   CREATININE 0.75 07/21/2015   BUN 16 07/21/2015   CO2 25 07/21/2015   TSH 1.700 07/21/2015   HGBA1C 5.9 (H) 07/21/2015     Assessment & Plan:

## 2016-11-09 NOTE — Assessment & Plan Note (Signed)

## 2016-11-09 NOTE — Patient Instructions (Signed)

## 2016-11-10 LAB — HIV ANTIBODY (ROUTINE TESTING W REFLEX): HIV 1&2 Ab, 4th Generation: NONREACTIVE

## 2016-11-10 LAB — HEPATITIS C ANTIBODY
Hepatitis C Ab: NONREACTIVE
SIGNAL TO CUT-OFF: 0.01 (ref <1.00)

## 2017-09-16 ENCOUNTER — Other Ambulatory Visit: Payer: Self-pay | Admitting: Internal Medicine

## 2017-10-21 ENCOUNTER — Telehealth: Payer: Self-pay | Admitting: Internal Medicine

## 2017-10-21 MED ORDER — MELOXICAM 15 MG PO TABS: 15.0000 mg | ORAL_TABLET | Freq: Every day | ORAL | 0 refills | Status: DC

## 2017-10-21 NOTE — Telephone Encounter (Signed)
Copied from [place] [id number]. Topic: Quick Communication - Rx Refill/Question >> Oct 21, 2017 12:48 PM Burchel, Abbi R wrote: Medication: meloxicam (MOBIC) 15 MG tablet   [facility]:  [id number] [place], [place] [address] Phone: [phone] Fax: [phone]   Pt was advised that RX refills may take up to 3 business days.

## 2017-11-10 ENCOUNTER — Encounter: Payer: BLUE CROSS/BLUE SHIELD | Admitting: Internal Medicine

## 2017-11-17 ENCOUNTER — Other Ambulatory Visit: Payer: Self-pay | Admitting: Internal Medicine

## 2017-11-17 MED ORDER — MELOXICAM 15 MG PO TABS: 15.0000 mg | ORAL_TABLET | Freq: Every day | ORAL | 2 refills | Status: DC

## 2017-11-17 NOTE — Telephone Encounter (Signed)
Copied from [place] [phone]. Topic: Quick Communication - Rx Refill/Question >> Nov 17, 2017  1:44 PM Sheran Luz wrote: Medication: meloxicam (MOBIC) 15 MG tablet  Pt is requesting a refill of this medication stating that she only has 3 tablets left.   Preferred Pharmacy (with phone number or street name): [id number] [place], [place] [phone] (Phone) [phone] (Fax)

## 2017-11-17 NOTE — Telephone Encounter (Signed)
Requested medication (s) are due for refill today:yes  Requested medication (s) are on the active medication list: yes  Last refill:   09/24/16  Future visit scheduled: yes  Notes to clinic:  Pharmacy note states office visit need for additional refills; no labs or valid encounter in last 12 months    Requested Prescriptions  Pending Prescriptions Disp Refills   meloxicam (MOBIC) 15 MG tablet 30 tablet 0    Sig: Take 1 tablet (15 mg total) by mouth daily.     Analgesics:  COX2 Inhibitors Failed - 11/17/2017  2:19 PM      Failed - HGB in normal range and within 360 days    Hemoglobin  Date Value Ref Range Status  11/09/2016 14.2 12.0 - 15.0 g/dL Final         Failed - Cr in normal range and within 360 days    Creatinine, Ser  Date Value Ref Range Status  11/09/2016 0.80 0.40 - 1.20 mg/dL Final         Failed - Valid encounter within last 12 months    Recent Outpatient Visits          1 year ago Preventative health care   [facility] -Georges Mouse, MD   2 years ago Encounter for preventative adult health care exam with abnormal findings   [facility], James W, MD   2 years ago Bilateral low back pain, with sciatica presence unspecified   Primary Care at [facility], [place], [place]      Future Appointments            In 1 week Jenny Reichmann, Hunt Oris, MD [place], [place] - Patient is not pregnant

## 2017-11-29 ENCOUNTER — Ambulatory Visit (INDEPENDENT_AMBULATORY_CARE_PROVIDER_SITE_OTHER): Payer: PPO | Admitting: Internal Medicine

## 2017-11-29 ENCOUNTER — Other Ambulatory Visit: Payer: Self-pay | Admitting: Internal Medicine

## 2017-11-29 ENCOUNTER — Other Ambulatory Visit (INDEPENDENT_AMBULATORY_CARE_PROVIDER_SITE_OTHER): Payer: PPO

## 2017-11-29 ENCOUNTER — Encounter: Payer: Self-pay | Admitting: Internal Medicine

## 2017-11-29 VITALS — BP 138/90 | HR 89 | Temp 98.0°F | Ht 65.0 in | Wt 170.0 lb

## 2017-11-29 DIAGNOSIS — M545 Low back pain, unspecified: Secondary | ICD-10-CM

## 2017-11-29 DIAGNOSIS — Z0000 Encounter for general adult medical examination without abnormal findings: Secondary | ICD-10-CM

## 2017-11-29 DIAGNOSIS — Z1159 Encounter for screening for other viral diseases: Secondary | ICD-10-CM

## 2017-11-29 DIAGNOSIS — Z23 Encounter for immunization: Secondary | ICD-10-CM

## 2017-11-29 LAB — BASIC METABOLIC PANEL
BUN: 20 mg/dL (ref 6–23)
CALCIUM: 9.9 mg/dL (ref 8.4–10.5)
CO2: 31 mEq/L (ref 19–32)
Chloride: 105 mEq/L (ref 96–112)
Creatinine, Ser: 0.68 mg/dL (ref 0.40–1.20)
GFR: 111.68 mL/min (ref >60.00)
Glucose, Bld: 98 mg/dL (ref 70–99)
Potassium: 3.9 mEq/L (ref 3.5–5.1)
SODIUM: 141 meq/L (ref 135–145)

## 2017-11-29 LAB — URINALYSIS, ROUTINE W REFLEX MICROSCOPIC
Leukocytes, UA: NEGATIVE
Nitrite: NEGATIVE
Total Protein, Urine: NEGATIVE
Urine Glucose: NEGATIVE
Urobilinogen, UA: 0.2 (ref 0.0–1.0)
pH: 5.5 (ref 5.0–8.0)

## 2017-11-29 LAB — LIPID PANEL
CHOL/HDL RATIO: 2
Cholesterol: 251 mg/dL — ABNORMAL HIGH (ref 0–200)
HDL: 105.6 mg/dL (ref >39.00)
NONHDL: 145.3
Triglycerides: 207 mg/dL — ABNORMAL HIGH (ref 0.0–149.0)
VLDL: 41.4 mg/dL — AB (ref 0.0–40.0)

## 2017-11-29 LAB — CBC WITH DIFFERENTIAL/PLATELET
BASOS ABS: 0.2 10*3/uL — AB (ref 0.0–0.1)
Basophils Relative: 2 % (ref 0.0–3.0)
EOS ABS: 0.2 10*3/uL (ref 0.0–0.7)
EOS PCT: 2.1 % (ref 0.0–5.0)
HCT: 43.1 % (ref 36.0–46.0)
HEMOGLOBIN: 14.1 g/dL (ref 12.0–15.0)
LYMPHS ABS: 2.9 10*3/uL (ref 0.7–4.0)
Lymphocytes Relative: 31.3 % (ref 12.0–46.0)
MCHC: 32.8 g/dL (ref 30.0–36.0)
MCV: 81.6 fl (ref 78.0–100.0)
MONO ABS: 0.6 10*3/uL (ref 0.1–1.0)
Monocytes Relative: 6.9 % (ref 3.0–12.0)
NEUTROS PCT: 57.7 % (ref 43.0–77.0)
Neutro Abs: 5.3 10*3/uL (ref 1.4–7.7)
Platelets: 292 10*3/uL (ref 150.0–400.0)
RBC: 5.28 Mil/uL — AB (ref 3.87–5.11)
RDW: 15.6 % — ABNORMAL HIGH (ref 11.5–15.5)
WBC: 9.2 10*3/uL (ref 4.0–10.5)

## 2017-11-29 LAB — HEPATIC FUNCTION PANEL
ALBUMIN: 4.2 g/dL (ref 3.5–5.2)
ALT: 25 U/L (ref 0–35)
AST: 23 U/L (ref 0–37)
Alkaline Phosphatase: 83 U/L (ref 39–117)
Bilirubin, Direct: 0.1 mg/dL (ref 0.0–0.3)
TOTAL PROTEIN: 7 g/dL (ref 6.0–8.3)
Total Bilirubin: 0.5 mg/dL (ref 0.2–1.2)

## 2017-11-29 LAB — LDL CHOLESTEROL, DIRECT: LDL DIRECT: 139 mg/dL

## 2017-11-29 LAB — TSH: TSH: 1.43 u[IU]/mL (ref 0.35–4.50)

## 2017-11-29 MED ORDER — CYCLOBENZAPRINE HCL 5 MG PO TABS: 5.0000 mg | ORAL_TABLET | Freq: Three times a day (TID) | ORAL | 1 refills | Status: DC | PRN

## 2017-11-29 MED ORDER — ROSUVASTATIN CALCIUM 20 MG PO TABS: 20.0000 mg | ORAL_TABLET | Freq: Every day | ORAL | 3 refills | Status: DC

## 2017-11-29 MED ORDER — MELOXICAM 15 MG PO TABS: 15.0000 mg | ORAL_TABLET | Freq: Every day | ORAL | 2 refills | Status: DC

## 2017-11-29 NOTE — Patient Instructions (Addendum)
You had the flu shot today  Please take all new medication as prescribed - the muscle relaxer  Please continue all other medications as before, and refills have been done if requested.  Please have the pharmacy call with any other refills you may need.  Please continue your efforts at being more active, low cholesterol diet, and weight control.  You are otherwise up to date with prevention measures today.  Please keep your appointments with your specialists as you may have planned  Please go to the LAB in the Basement (turn left off the elevator) for the tests to be done today  You will be contacted by phone if any changes need to be made immediately.  Otherwise, you will receive a letter about your results with an explanation, but please check with MyChart first.  Please remember to sign up for MyChart if you have not done so, as this will be important to you in the future with finding out test results, communicating by private email, and scheduling acute appointments online when needed.  Please return in 1 year for your yearly visit, or sooner if needed, with Lab testing done 3-5 days before

## 2017-11-29 NOTE — Progress Notes (Signed)
Subjective:    Patient ID: Jennifer Hobbs, female    DOB: Nov 06, 1950, 67 y.o.   MRN: [id number]  HPI  Here for wellness and f/u;  Overall doing ok;  Pt denies Chest pain, worsening SOB, DOE, wheezing, orthopnea, PND, worsening LE edema, palpitations, dizziness or syncope.  Pt denies neurological change such as new headache, facial or extremity weakness.  Pt denies polydipsia, polyuria, or low sugar symptoms. Pt states overall good compliance with treatment and medications, good tolerability, and has been trying to follow appropriate diet.  Pt denies worsening depressive symptoms, suicidal ideation or panic. No fever, night sweats, wt loss, loss of appetite, or other constitutional symptoms.  Pt states good ability with ADL's, has low fall risk, home safety reviewed and adequate, no other significant changes in hearing or vision, and only occasionally active with exercise.  Also; Pt continues to have recurring LBP without change in severity, bowel or bladder change, fever, wt loss,  worsening LE pain/numbness/weakness, gait change or falls.Due for flu shot Past Medical History:  Diagnosis Date  . Allergy   . DDD (degenerative disc disease), lumbar   . Degenerative disc disease, lumbar 07/10/2015   Past Surgical History:  Procedure Laterality Date  . UTERINE FIBROID SURGERY      reports that she quit smoking about 8 years ago. She has never used smokeless tobacco. She reports that she drinks alcohol. She reports that she does not use drugs. family history includes Diabetes in her sister; Multiple sclerosis (age of onset: 29) in her daughter; Stroke in her brother. No Known Allergies Current Outpatient Medications on File Prior to Visit  Medication Sig Dispense Refill  . aspirin EC 81 MG tablet Take 1 tablet (81 mg total) by mouth daily.    . Multiple Vitamin (MULTIVITAMIN) tablet Take 1 tablet by mouth daily.     No current facility-administered medications on file prior to visit.     Review of Systems Constitutional: Negative for other unusual diaphoresis, sweats, appetite or weight changes HENT: Negative for other worsening hearing loss, ear pain, facial swelling, mouth sores or neck stiffness.   Eyes: Negative for other worsening pain, redness or other visual disturbance.  Respiratory: Negative for other stridor or swelling Cardiovascular: Negative for other palpitations or other chest pain  Gastrointestinal: Negative for worsening diarrhea or loose stools, blood in stool, distention or other pain Genitourinary: Negative for hematuria, flank pain or other change in urine volume.  Musculoskeletal: Negative for myalgias or other joint swelling.  Skin: Negative for other color change, or other wound or worsening drainage.  Neurological: Negative for other syncope or numbness. Hematological: Negative for other adenopathy or swelling Psychiatric/Behavioral: Negative for hallucinations, other worsening agitation, SI, self-injury, or new decreased concentration All other system neg per pt    Objective:   Physical Exam BP 138/90   Pulse 89   Temp 98 F (36.7 C) (Oral)   Ht 5\' 5"  (1.651 m)   Wt 170 lb (77.1 kg)   SpO2 96%   BMI 28.29 kg/m  VS noted,  Constitutional: Pt is oriented to person, place, and time. Appears well-developed and well-nourished, in no significant distress and comfortable Head: Normocephalic and atraumatic  Eyes: Conjunctivae and EOM are normal. Pupils are equal, round, and reactive to light Right Ear: External ear normal without discharge Left Ear: External ear normal without discharge Nose: Nose without discharge or deformity Mouth/Throat: Oropharynx is without other ulcerations and moist  Neck: Normal range of motion. Neck supple. No  JVD present. No tracheal deviation present or significant neck LA or mass Cardiovascular: Normal rate, regular rhythm, normal heart sounds and intact distal pulses.   Pulmonary/Chest: WOB normal and breath  sounds without rales or wheezing  Abdominal: Soft. Bowel sounds are normal. NT. No HSM  Musculoskeletal: Normal range of motion. Exhibits no edema Lymphadenopathy: Has no other cervical adenopathy.  Neurological: Pt is alert and oriented to person, place, and time. Pt has normal reflexes. No cranial nerve deficit. Motor grossly intact, Gait intact Skin: Skin is warm and dry. No rash noted or new ulcerations Psychiatric:  Has normal mood and affect. Behavior is normal without agitation No other exam findings    Assessment & Plan:

## 2017-11-29 NOTE — Assessment & Plan Note (Signed)

## 2017-11-29 NOTE — Addendum Note (Signed)
Addended by: Boris Lown B on: 11/29/2017 03:50 PM   Modules accepted: Orders

## 2017-11-29 NOTE — Assessment & Plan Note (Signed)
To add muscle relaxer prn

## 2017-11-30 ENCOUNTER — Telehealth: Payer: Self-pay

## 2017-11-30 LAB — HEPATITIS C ANTIBODY
Hepatitis C Ab: NONREACTIVE
SIGNAL TO CUT-OFF: 0.01 (ref <1.00)

## 2017-11-30 NOTE — Telephone Encounter (Signed)
Called pt, LVM.   CRM created.  

## 2017-11-30 NOTE — Telephone Encounter (Signed)
-----   Message from Biagio Borg, MD sent at 11/29/2017  7:50 PM EDT ----- Letter sent, cont same tx except  The test results show that your current treatment is OK, except the LDL cholesterol is moderately elevated.  We need to ask you to start a cholesterol medication called crestor 20 mg per day to help reduce your risk of stroke and heart disease  I will send a new prescription, and you should hear from the office as well.  Jennifer Hobbs to please inform pt, I will do rx

## 2017-12-08 DIAGNOSIS — Z01419 Encounter for gynecological examination (general) (routine) without abnormal findings: Secondary | ICD-10-CM | POA: Diagnosis not present

## 2017-12-08 DIAGNOSIS — Z1231 Encounter for screening mammogram for malignant neoplasm of breast: Secondary | ICD-10-CM | POA: Diagnosis not present

## 2018-01-28 IMAGING — CT CT HEAD W/O CM
1 series · 16 of 30 positions shown, 20 images · non-contrast
Comparison: CT scan 10/14/2008

CLINICAL DATA: Recurrent right side paresthesia

EXAM:
CT HEAD WITHOUT CONTRAST
TECHNIQUE: Contiguous axial images were obtained from the base of the skull
through the vertex without intravenous contrast.

[Series 2: head wo · axial · 0.40mm/px · z∈[-158,-18]mm · 16 of 30 slices shown, 20 images]
[im 2/30  brain]
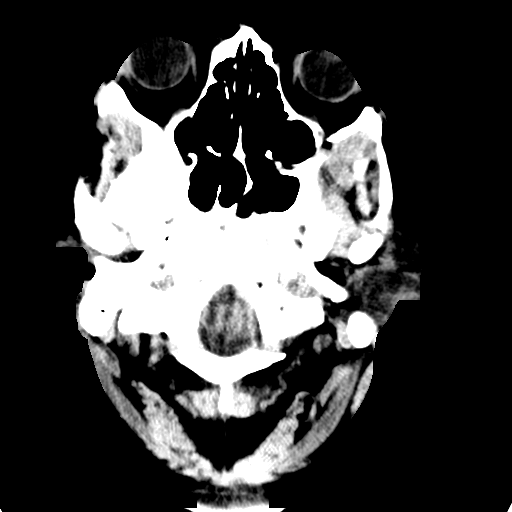
[im 2/30  bone]
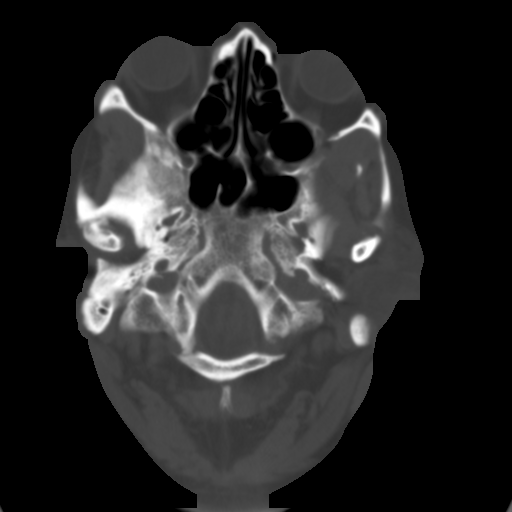
[im 4/30  brain]
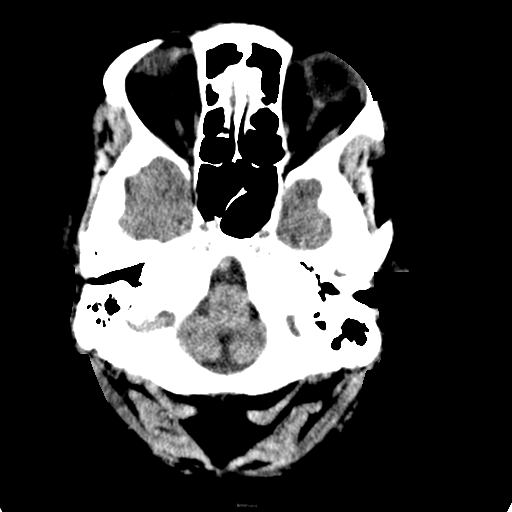
[im 6/30  brain]
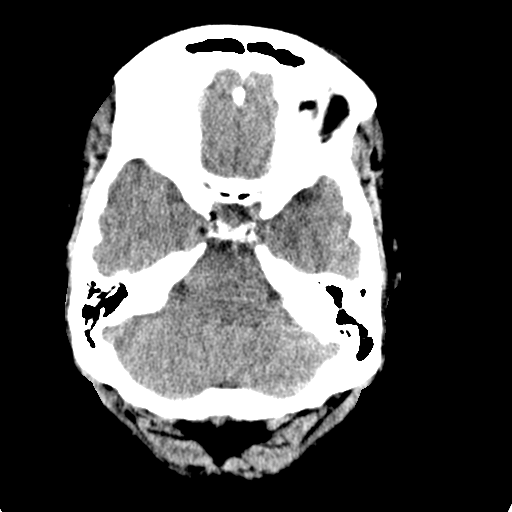
[im 8/30  brain]
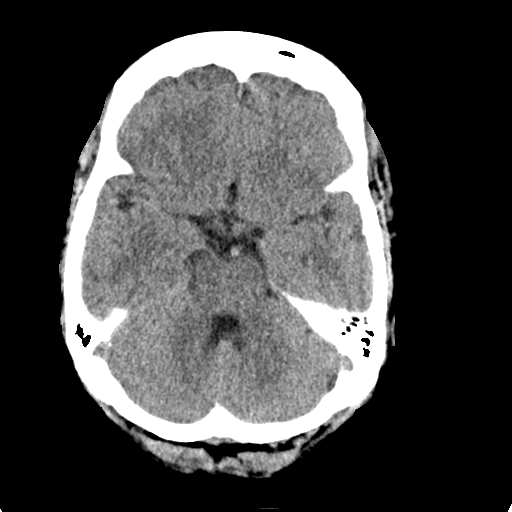
[im 9/30  brain]
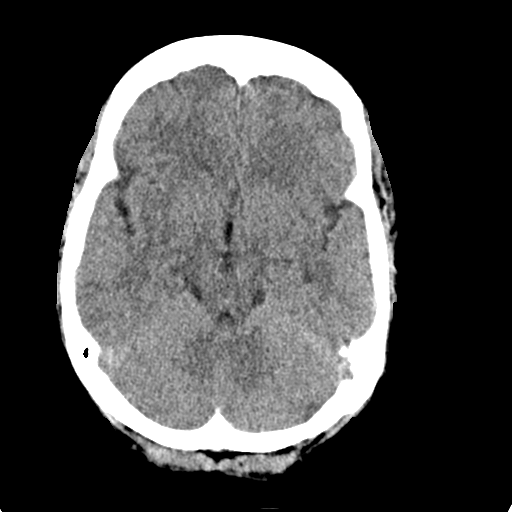
[im 9/30  bone]
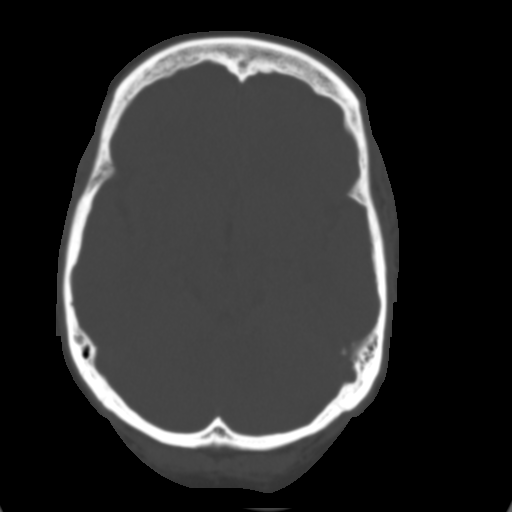
[im 11/30  brain]
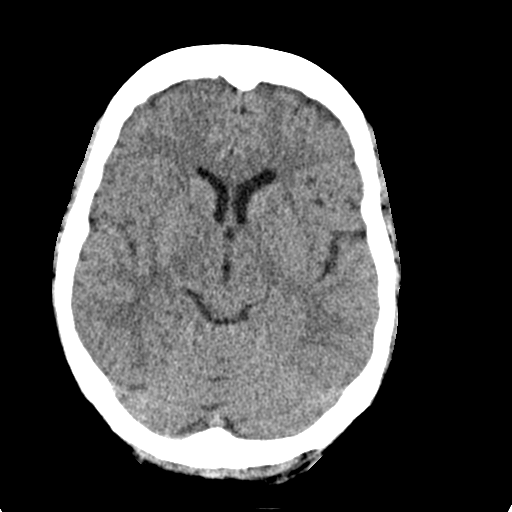
[im 13/30  brain]
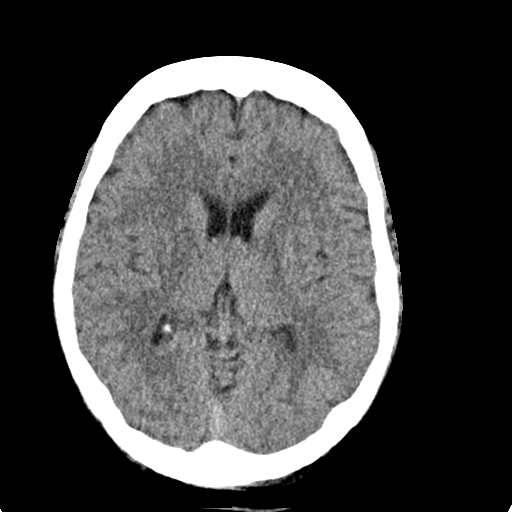
[im 15/30  brain]
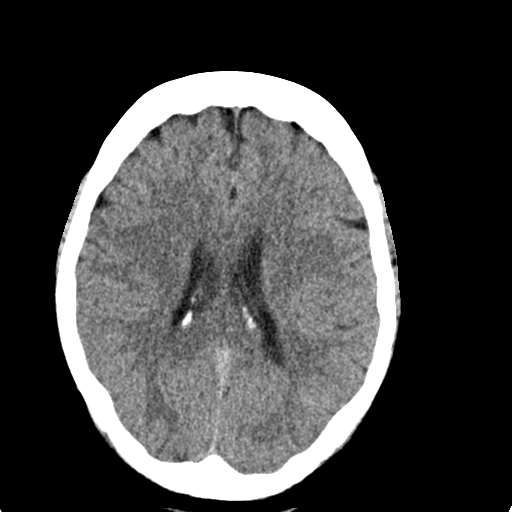
[im 16/30  brain]
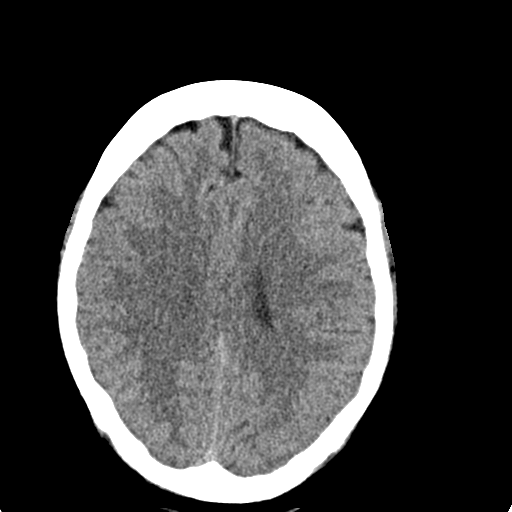
[im 16/30  bone]
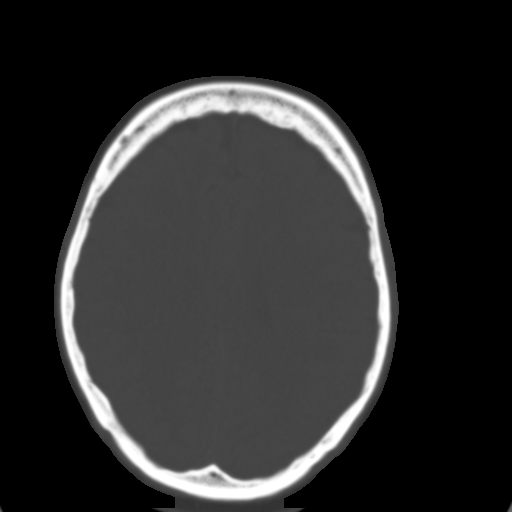
[im 18/30  brain]
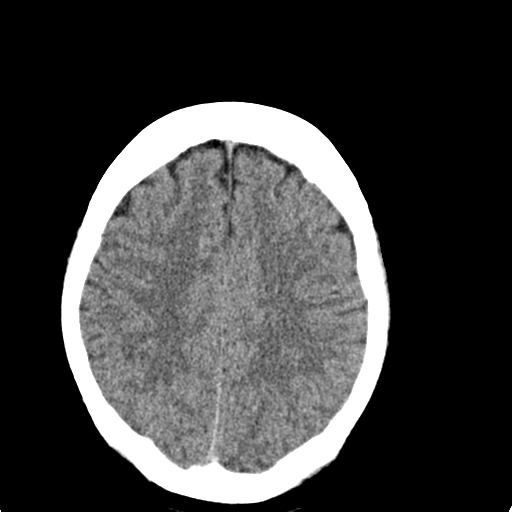
[im 20/30  brain]
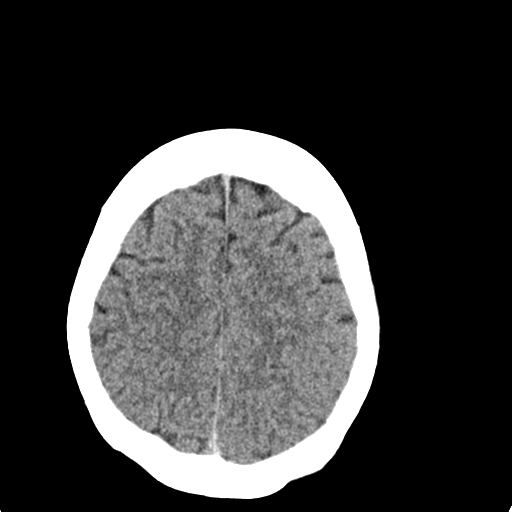
[im 22/30  brain]
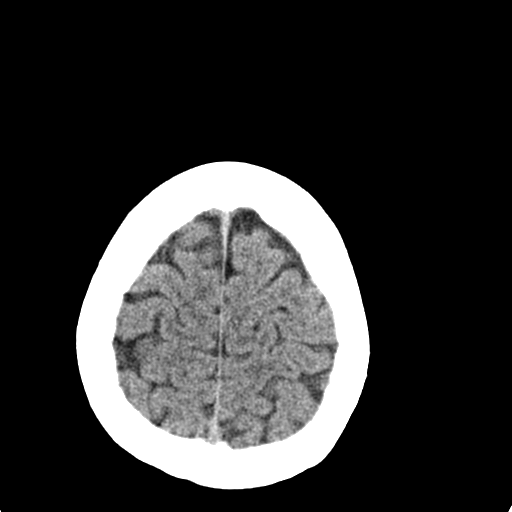
[im 23/30  brain]
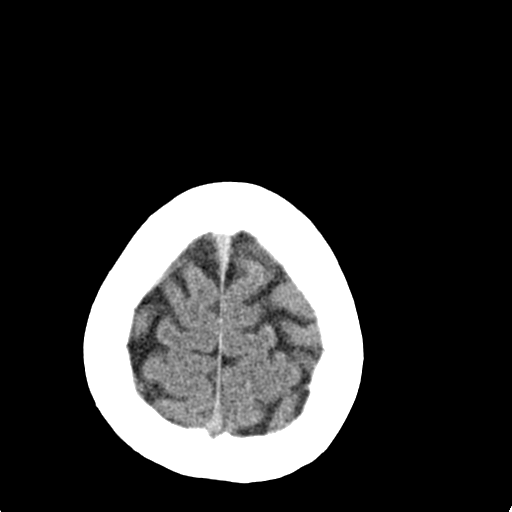
[im 23/30  bone]
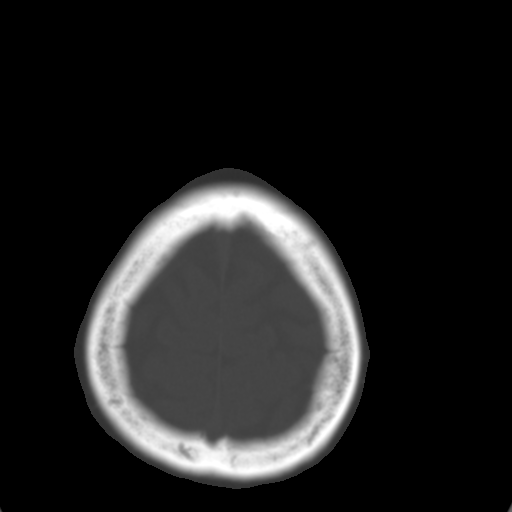
[im 25/30  brain]
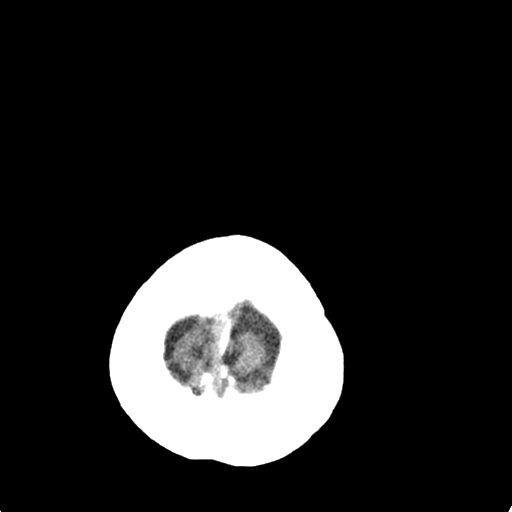
[im 27/30  brain]
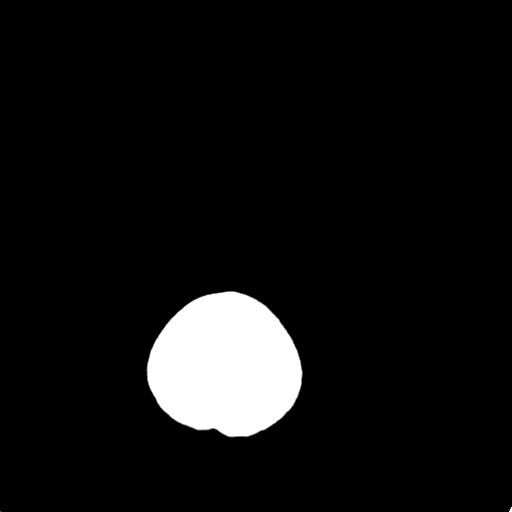
[im 29/30  brain]
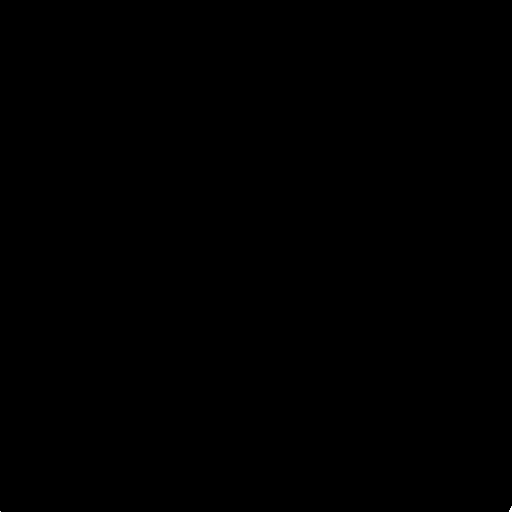

[16 of 30 positions shown; findings below may reference images not displayed]

FINDINGS: Brain: No intracranial hemorrhage, mass effect or midline shift. No
acute cortical infarction. No mass lesion is noted on this
unenhanced scan. No hydrocephalus. The gray and white-matter
differentiation is preserved. No intra or extra-axial fluid
collection.

Vascular: No hyperdense vessel or unexpected calcification.

Skull: Negative for fracture or focal lesion.

Sinuses/Orbits: No acute findings.

Other: None.
IMPRESSION: 1. No acute intracranial abnormality. No definite acute cortical
infarction.

## 2018-01-28 IMAGING — CR DG LUMBAR SPINE COMPLETE 4+V
5 series · 5 of 5 positions shown · non-contrast
Comparison: None.

CLINICAL DATA: Low back pain for 2 weeks.  No injury.

EXAM:
LUMBAR SPINE - COMPLETE 4+ VIEW

[AP]
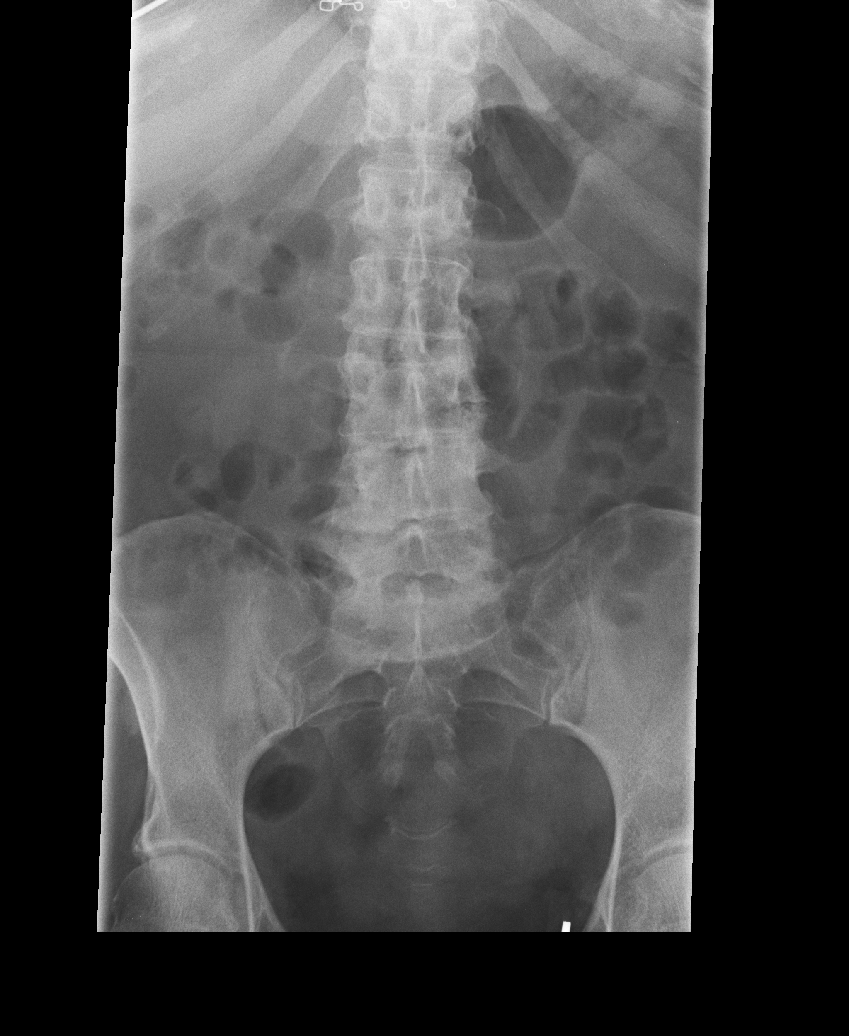

[rpo]
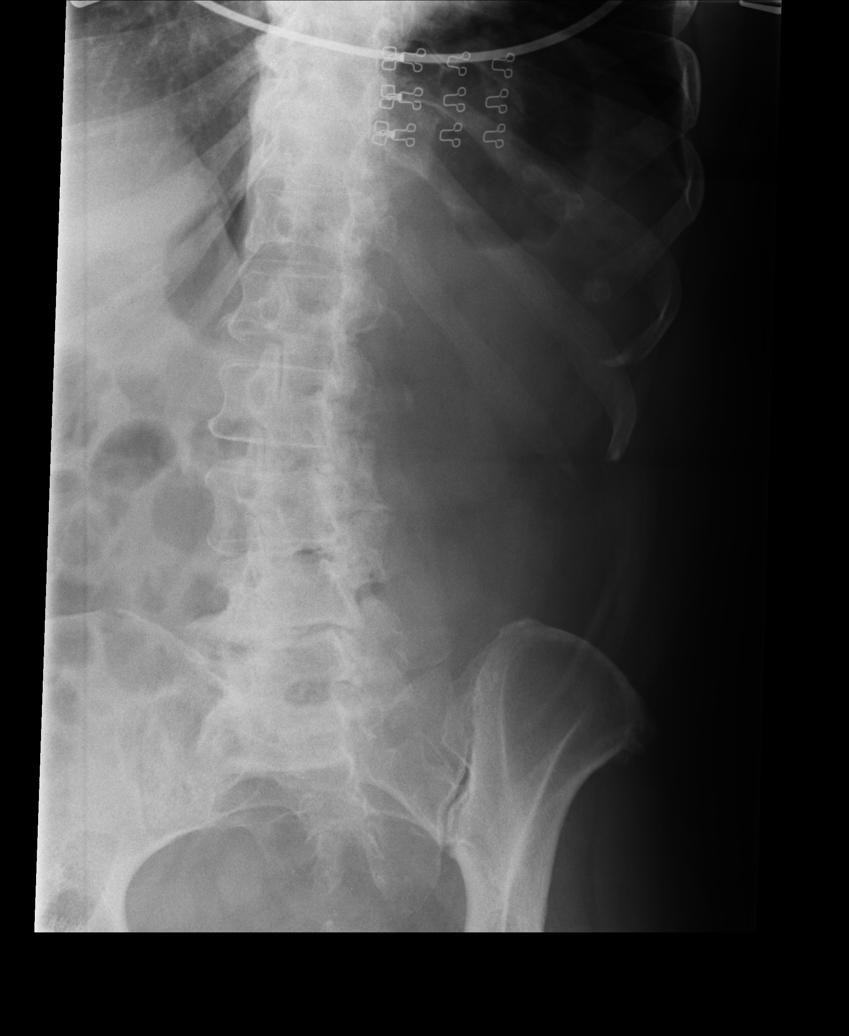

[lateral]
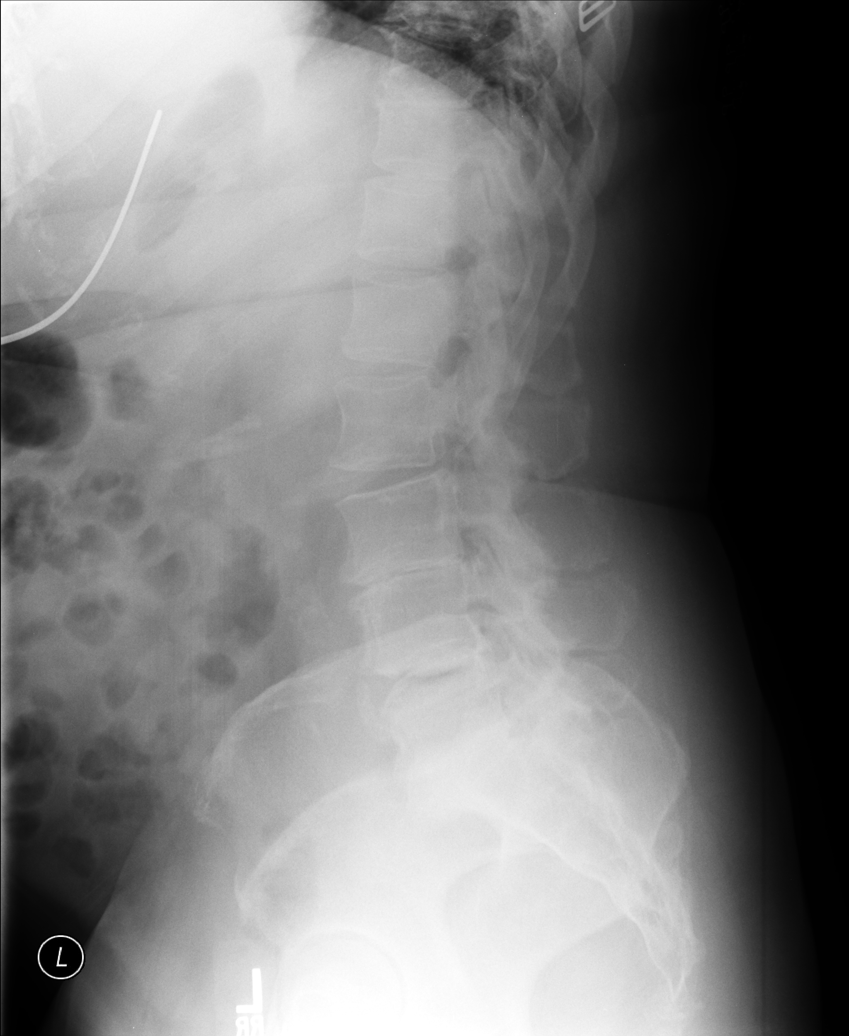

[l5 s1]
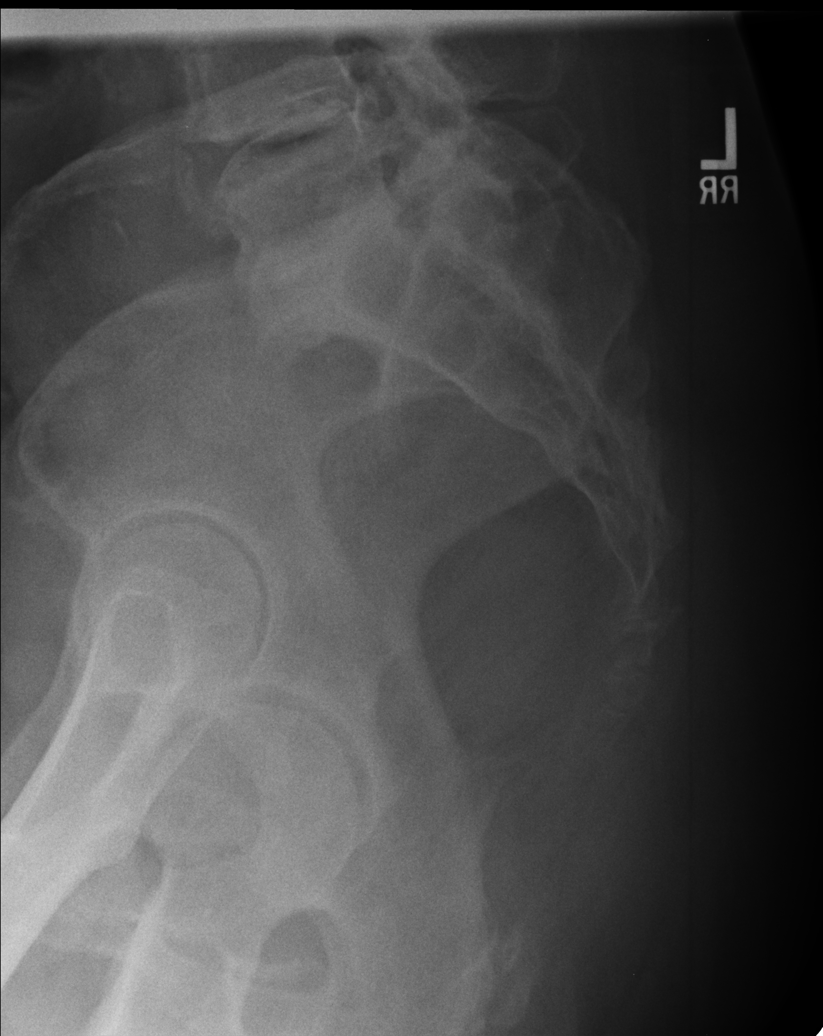

[lpo]
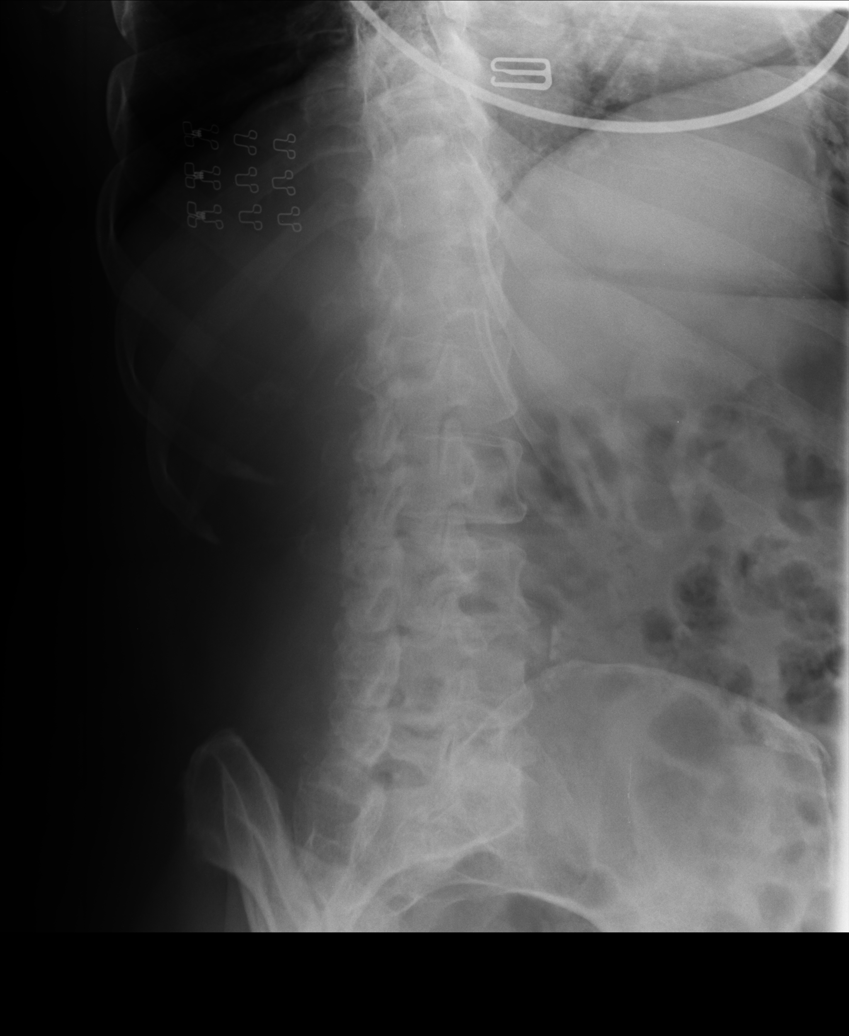

[5 of 5 positions shown; findings below may reference images not displayed]

FINDINGS: There is no evidence of lumbar spine fracture. Alignment is normal
except for trace anterolisthesis L2-3 Advanced disc space narrowing
at L3-4, L4-5, and L5-S1. Vascular calcification. No worrisome
osseous lesion. Marked endplate spurring. Lower lumbar facet
arthropathy.
IMPRESSION: Degenerative changes as described.

## 2018-04-12 ENCOUNTER — Other Ambulatory Visit: Payer: Self-pay | Admitting: Internal Medicine

## 2018-09-09 ENCOUNTER — Other Ambulatory Visit: Payer: Self-pay | Admitting: Internal Medicine

## 2018-11-19 ENCOUNTER — Other Ambulatory Visit: Payer: Self-pay | Admitting: Internal Medicine

## 2018-12-01 ENCOUNTER — Other Ambulatory Visit: Payer: Self-pay

## 2018-12-01 ENCOUNTER — Encounter: Payer: Self-pay | Admitting: Internal Medicine

## 2018-12-01 ENCOUNTER — Ambulatory Visit (INDEPENDENT_AMBULATORY_CARE_PROVIDER_SITE_OTHER): Payer: PPO | Admitting: Internal Medicine

## 2018-12-01 ENCOUNTER — Other Ambulatory Visit (INDEPENDENT_AMBULATORY_CARE_PROVIDER_SITE_OTHER): Payer: PPO

## 2018-12-01 VITALS — BP 144/94 | HR 96 | Temp 98.2°F | Ht 65.0 in | Wt 183.0 lb

## 2018-12-01 DIAGNOSIS — Z23 Encounter for immunization: Secondary | ICD-10-CM | POA: Diagnosis not present

## 2018-12-01 DIAGNOSIS — E2839 Other primary ovarian failure: Secondary | ICD-10-CM

## 2018-12-01 DIAGNOSIS — E785 Hyperlipidemia, unspecified: Secondary | ICD-10-CM | POA: Diagnosis not present

## 2018-12-01 DIAGNOSIS — Z0000 Encounter for general adult medical examination without abnormal findings: Secondary | ICD-10-CM

## 2018-12-01 DIAGNOSIS — R739 Hyperglycemia, unspecified: Secondary | ICD-10-CM | POA: Diagnosis not present

## 2018-12-01 LAB — HEPATIC FUNCTION PANEL
ALT: 22 U/L (ref 0–35)
AST: 23 U/L (ref 0–37)
Albumin: 4.3 g/dL (ref 3.5–5.2)
Alkaline Phosphatase: 87 U/L (ref 39–117)
Bilirubin, Direct: 0.1 mg/dL (ref 0.0–0.3)
Total Bilirubin: 0.5 mg/dL (ref 0.2–1.2)
Total Protein: 7 g/dL (ref 6.0–8.3)

## 2018-12-01 LAB — LIPID PANEL
Cholesterol: 210 mg/dL — ABNORMAL HIGH (ref 0–200)
HDL: 90.8 mg/dL (ref >39.00)
LDL Cholesterol: 86 mg/dL (ref 0–99)
NonHDL: 118.82
Total CHOL/HDL Ratio: 2
Triglycerides: 164 mg/dL — ABNORMAL HIGH (ref 0.0–149.0)
VLDL: 32.8 mg/dL (ref 0.0–40.0)

## 2018-12-01 LAB — URINALYSIS, ROUTINE W REFLEX MICROSCOPIC
Bilirubin Urine: NEGATIVE
Ketones, ur: NEGATIVE
Leukocytes,Ua: NEGATIVE
Nitrite: NEGATIVE
Specific Gravity, Urine: >=1.03 — AB (ref 1.000–1.030)
Total Protein, Urine: NEGATIVE
Urine Glucose: NEGATIVE
Urobilinogen, UA: 0.2 (ref 0.0–1.0)
pH: 5.5 (ref 5.0–8.0)

## 2018-12-01 LAB — CBC WITH DIFFERENTIAL/PLATELET
Basophils Absolute: 0.2 10*3/uL — ABNORMAL HIGH (ref 0.0–0.1)
Basophils Relative: 1.7 % (ref 0.0–3.0)
Eosinophils Absolute: 0.2 10*3/uL (ref 0.0–0.7)
Eosinophils Relative: 2 % (ref 0.0–5.0)
HCT: 43.4 % (ref 36.0–46.0)
Hemoglobin: 13.9 g/dL (ref 12.0–15.0)
Lymphocytes Relative: 34.4 % (ref 12.0–46.0)
Lymphs Abs: 3 10*3/uL (ref 0.7–4.0)
MCHC: 32 g/dL (ref 30.0–36.0)
MCV: 82 fl (ref 78.0–100.0)
Monocytes Absolute: 0.6 10*3/uL (ref 0.1–1.0)
Monocytes Relative: 6.6 % (ref 3.0–12.0)
Neutro Abs: 4.8 10*3/uL (ref 1.4–7.7)
Neutrophils Relative %: 55.3 % (ref 43.0–77.0)
Platelets: 283 10*3/uL (ref 150.0–400.0)
RBC: 5.28 Mil/uL — ABNORMAL HIGH (ref 3.87–5.11)
RDW: 16.2 % — ABNORMAL HIGH (ref 11.5–15.5)
WBC: 8.7 10*3/uL (ref 4.0–10.5)

## 2018-12-01 LAB — BASIC METABOLIC PANEL
BUN: 22 mg/dL (ref 6–23)
CO2: 29 mEq/L (ref 19–32)
Calcium: 9.8 mg/dL (ref 8.4–10.5)
Chloride: 105 mEq/L (ref 96–112)
Creatinine, Ser: 1.01 mg/dL (ref 0.40–1.20)
GFR: 66.35 mL/min (ref >60.00)
Glucose, Bld: 101 mg/dL — ABNORMAL HIGH (ref 70–99)
Potassium: 3.7 mEq/L (ref 3.5–5.1)
Sodium: 142 mEq/L (ref 135–145)

## 2018-12-01 LAB — TSH: TSH: 1.07 u[IU]/mL (ref 0.35–4.50)

## 2018-12-01 LAB — HEMOGLOBIN A1C: Hgb A1c MFr Bld: 6.1 % (ref 4.6–6.5)

## 2018-12-01 NOTE — Patient Instructions (Addendum)
You had the flu shot today, and Tdap tetanus shot, and Pneumovax pneumonia shot  The other pneumonia shot called Prevnar 13 will need to be done next year  Please schedule the bone density test before leaving today at the scheduling desk (where you check out)  Please continue all other medications as before, and refills have been done if requested.  Please have the pharmacy call with any other refills you may need.  Please continue your efforts at being more active, low cholesterol diet, and weight control.  You are otherwise up to date with prevention measures today.  Please keep your appointments with your specialists as you may have planned  Please go to the LAB in the Basement (turn left off the elevator) for the tests to be done today  You will be contacted by phone if any changes need to be made immediately.  Otherwise, you will receive a letter about your results with an explanation, but please check with MyChart first.  Please remember to sign up for MyChart if you have not done so, as this will be important to you in the future with finding out test results, communicating by private email, and scheduling acute appointments online when needed.  Please return in 1 year for your yearly visit, or sooner if needed, with Lab testing done 3-5 days before

## 2018-12-01 NOTE — Progress Notes (Signed)
Subjective:    Patient ID: Jennifer Hobbs, female    DOB: January 26, 1951, 68 y.o.   MRN: [id number]  HPI  Here for wellness and f/u;  Overall doing ok;  Pt denies Chest pain, worsening SOB, DOE, wheezing, orthopnea, PND, worsening LE edema, palpitations, dizziness or syncope.  Pt denies neurological change such as new headache, facial or extremity weakness.  Pt denies polydipsia, polyuria, or low sugar symptoms. Pt states overall good compliance with treatment and medications, good tolerability, and has been trying to follow appropriate diet.  Pt denies worsening depressive symptoms, suicidal ideation or panic. No fever, night sweats, wt loss, loss of appetite, or other constitutional symptoms.  Pt states good ability with ADL's, has low fall risk, home safety reviewed and adequate, no other significant changes in hearing or vision, and only occasionally active with exercise. Tolerating new statin well Aunt and cousin died recently. Has cat, and grandson stays with her, so that helps.  Has had mild worsening depressive symptoms, suicidal ideation, or panic; has ongoing anxiety, delines tx, plans to counsel wth family.  Pt plans to call for mammogram. Due for flu, pneumovax, Tdap today Past Medical History:  Diagnosis Date  . Allergy   . DDD (degenerative disc disease), lumbar   . Degenerative disc disease, lumbar 07/10/2015   Past Surgical History:  Procedure Laterality Date  . UTERINE FIBROID SURGERY      reports that she quit smoking about 9 years ago. She has never used smokeless tobacco. She reports current alcohol use. She reports that she does not use drugs. family history includes Diabetes in her sister; Multiple sclerosis (age of onset: 69) in her daughter; Stroke in her brother. No Known Allergies Current Outpatient Medications on File Prior to Visit  Medication Sig Dispense Refill  . aspirin EC 81 MG tablet Take 1 tablet (81 mg total) by mouth daily.    . cyclobenzaprine (FLEXERIL) 5  MG tablet Take 1 tablet (5 mg total) by mouth 3 (three) times daily as needed for muscle spasms. 30 tablet 1  . meloxicam (MOBIC) 15 MG tablet TAKE 1 TABLET BY MOUTH EVERY DAY 30 tablet 5  . Multiple Vitamin (MULTIVITAMIN) tablet Take 1 tablet by mouth daily.    . rosuvastatin (CRESTOR) 20 MG tablet TAKE 1 TABLET BY MOUTH EVERY DAY 90 tablet 0   No current facility-administered medications on file prior to visit.    Review of Systems Constitutional: Negative for other unusual diaphoresis, sweats, appetite or weight changes HENT: Negative for other worsening hearing loss, ear pain, facial swelling, mouth sores or neck stiffness.   Eyes: Negative for other worsening pain, redness or other visual disturbance.  Respiratory: Negative for other stridor or swelling Cardiovascular: Negative for other palpitations or other chest pain  Gastrointestinal: Negative for worsening diarrhea or loose stools, blood in stool, distention or other pain Genitourinary: Negative for hematuria, flank pain or other change in urine volume.  Musculoskeletal: Negative for myalgias or other joint swelling.  Skin: Negative for other color change, or other wound or worsening drainage.  Neurological: Negative for other syncope or numbness. Hematological: Negative for other adenopathy or swelling Psychiatric/Behavioral: Negative for hallucinations, other worsening agitation, SI, self-injury, or new decreased concentration All otherwise neg per pt    Objective:   Physical Exam BP (!) 144/94   Pulse 96   Temp 98.2 F (36.8 C) (Oral)   Ht 5\' 5"  (1.651 m)   Wt 183 lb (83 kg)   SpO2 100%  BMI 30.45 kg/m  VS noted,  Constitutional: Pt is oriented to person, place, and time. Appears well-developed and well-nourished, in no significant distress and comfortable Head: Normocephalic and atraumatic  Eyes: Conjunctivae and EOM are normal. Pupils are equal, round, and reactive to light Right Ear: External ear normal without  discharge Left Ear: External ear normal without discharge Nose: Nose without discharge or deformity Mouth/Throat: Oropharynx is without other ulcerations and moist  Neck: Normal range of motion. Neck supple. No JVD present. No tracheal deviation present or significant neck LA or mass Cardiovascular: Normal rate, regular rhythm, normal heart sounds and intact distal pulses.   Pulmonary/Chest: WOB normal and breath sounds without rales or wheezing  Abdominal: Soft. Bowel sounds are normal. NT. No HSM  Musculoskeletal: Normal range of motion. Exhibits no edema Lymphadenopathy: Has no other cervical adenopathy.  Neurological: Pt is alert and oriented to person, place, and time. Pt has normal reflexes. No cranial nerve deficit. Motor grossly intact, Gait intact Skin: Skin is warm and dry. No rash noted or new ulcerations Psychiatric:  Has normal mood and affect. Behavior is normal without agitation All otherwise neg per pt Lab Results  Component Value Date   WBC 8.7 12/01/2018   HGB 13.9 12/01/2018   HCT 43.4 12/01/2018   PLT 283.0 12/01/2018   GLUCOSE 101 (H) 12/01/2018   CHOL 210 (H) 12/01/2018   TRIG 164.0 (H) 12/01/2018   HDL 90.80 12/01/2018   LDLDIRECT 139.0 11/29/2017   LDLCALC 86 12/01/2018   ALT 22 12/01/2018   AST 23 12/01/2018   NA 142 12/01/2018   K 3.7 12/01/2018   CL 105 12/01/2018   CREATININE 1.01 12/01/2018   BUN 22 12/01/2018   CO2 29 12/01/2018   TSH 1.07 12/01/2018   HGBA1C 6.1 12/01/2018       Assessment & Plan:

## 2018-12-02 ENCOUNTER — Encounter: Payer: Self-pay | Admitting: Internal Medicine

## 2018-12-02 NOTE — Assessment & Plan Note (Signed)
stable overall by history and exam, recent data reviewed with pt, and pt to continue medical treatment as before,  to f/u any worsening symptoms or concerns  

## 2018-12-02 NOTE — Assessment & Plan Note (Signed)

## 2018-12-02 NOTE — Assessment & Plan Note (Signed)
stable overall by history and exam, recent data reviewed with pt, and pt to continue medical treatment as before,  to f/u any worsening symptoms or concerns, cont statin 

## 2018-12-05 ENCOUNTER — Ambulatory Visit (INDEPENDENT_AMBULATORY_CARE_PROVIDER_SITE_OTHER)
Admission: RE | Admit: 2018-12-05 | Discharge: 2018-12-05 | Disposition: A | Discharge status: Home / Self Care | Payer: PPO | Source: Ambulatory Visit | Attending: Internal Medicine | Admitting: Internal Medicine

## 2018-12-05 ENCOUNTER — Encounter: Payer: Self-pay | Admitting: Internal Medicine

## 2018-12-05 ENCOUNTER — Other Ambulatory Visit: Payer: Self-pay

## 2018-12-05 DIAGNOSIS — E2839 Other primary ovarian failure: Secondary | ICD-10-CM | POA: Diagnosis not present

## 2018-12-27 ENCOUNTER — Other Ambulatory Visit: Payer: Self-pay | Admitting: Internal Medicine

## 2019-01-09 DIAGNOSIS — Z01419 Encounter for gynecological examination (general) (routine) without abnormal findings: Secondary | ICD-10-CM | POA: Diagnosis not present

## 2019-01-09 DIAGNOSIS — Z1231 Encounter for screening mammogram for malignant neoplasm of breast: Secondary | ICD-10-CM | POA: Diagnosis not present

## 2019-03-27 ENCOUNTER — Other Ambulatory Visit: Payer: Self-pay | Admitting: Internal Medicine

## 2019-04-12 ENCOUNTER — Ambulatory Visit: Payer: PPO | Attending: Internal Medicine

## 2019-04-12 DIAGNOSIS — Z23 Encounter for immunization: Secondary | ICD-10-CM | POA: Insufficient documentation

## 2019-04-12 NOTE — Progress Notes (Signed)
   Covid-19 Vaccination Clinic  Name:  Jennifer Hobbs    MRN: [id number] DOB: [birth year]  04/12/2019  [name] was observed post Covid-19 immunization for 15 minutes without incidence. She was provided with Vaccine Information Sheet and instruction to access the V-Safe system.   [name] was instructed to call 911 with any severe reactions post vaccine: Marland Kitchen Difficulty breathing  . Swelling of your face and throat  . A fast heartbeat  . A bad rash all over your body  . Dizziness and weakness    Immunizations Administered    Name Date Dose VIS Date Route   Pfizer COVID-19 Vaccine 04/12/2019 12:48 PM 0.3 mL 01/26/2019 Intramuscular   Manufacturer: [place]   Lot: [id number]   [place]: [id number]

## 2019-05-08 ENCOUNTER — Ambulatory Visit: Payer: PPO | Attending: Internal Medicine

## 2019-05-08 DIAGNOSIS — Z23 Encounter for immunization: Secondary | ICD-10-CM

## 2019-05-08 NOTE — Progress Notes (Signed)
   Covid-19 Vaccination Clinic  Name:  Jennifer Hobbs    MRN: [id number] DOB: 1950-10-05  05/08/2019  [name] was observed post Covid-19 immunization for 15 minutes without incident. She was provided with Vaccine Information Sheet and instruction to access the V-Safe system.   [name] was instructed to call 911 with any severe reactions post vaccine: Marland Kitchen Difficulty breathing  . Swelling of face and throat  . A fast heartbeat  . A bad rash all over body  . Dizziness and weakness   Immunizations Administered    Name Date Dose VIS Date Route   Pfizer COVID-19 Vaccine 05/08/2019 12:02 PM 0.3 mL 01/26/2019 Intramuscular   Manufacturer: [place]   Lot: [id number]   [place]: [id number]

## 2019-06-25 ENCOUNTER — Other Ambulatory Visit: Payer: Self-pay | Admitting: Internal Medicine

## 2019-09-03 ENCOUNTER — Other Ambulatory Visit: Payer: Self-pay | Admitting: Internal Medicine

## 2019-11-28 ENCOUNTER — Telehealth: Payer: Self-pay | Admitting: Internal Medicine

## 2019-11-28 NOTE — Telephone Encounter (Signed)
LVM for pt to rtn my call to schedule AWV-I with [facility] 

## 2019-12-03 ENCOUNTER — Encounter: Payer: Self-pay | Admitting: Internal Medicine

## 2019-12-03 ENCOUNTER — Ambulatory Visit (INDEPENDENT_AMBULATORY_CARE_PROVIDER_SITE_OTHER): Payer: PPO | Admitting: Internal Medicine

## 2019-12-03 ENCOUNTER — Other Ambulatory Visit: Payer: Self-pay | Admitting: Internal Medicine

## 2019-12-03 ENCOUNTER — Other Ambulatory Visit: Payer: Self-pay

## 2019-12-03 VITALS — BP 160/90 | HR 90 | Temp 98.2°F | Ht 65.0 in | Wt 182.0 lb

## 2019-12-03 DIAGNOSIS — R030 Elevated blood-pressure reading, without diagnosis of hypertension: Secondary | ICD-10-CM | POA: Diagnosis not present

## 2019-12-03 DIAGNOSIS — E559 Vitamin D deficiency, unspecified: Secondary | ICD-10-CM

## 2019-12-03 DIAGNOSIS — Z23 Encounter for immunization: Secondary | ICD-10-CM

## 2019-12-03 DIAGNOSIS — E538 Deficiency of other specified B group vitamins: Secondary | ICD-10-CM | POA: Diagnosis not present

## 2019-12-03 DIAGNOSIS — Z0000 Encounter for general adult medical examination without abnormal findings: Secondary | ICD-10-CM

## 2019-12-03 DIAGNOSIS — R739 Hyperglycemia, unspecified: Secondary | ICD-10-CM

## 2019-12-03 LAB — BASIC METABOLIC PANEL
BUN: 20 mg/dL (ref 6–23)
CO2: 32 mEq/L (ref 19–32)
Calcium: 9.9 mg/dL (ref 8.4–10.5)
Chloride: 102 mEq/L (ref 96–112)
Creatinine, Ser: 0.63 mg/dL (ref 0.40–1.20)
GFR: 92.81 mL/min (ref >60.00)
Glucose, Bld: 98 mg/dL (ref 70–99)
Potassium: 3.7 mEq/L (ref 3.5–5.1)
Sodium: 140 mEq/L (ref 135–145)

## 2019-12-03 LAB — LIPID PANEL
Cholesterol: 206 mg/dL — ABNORMAL HIGH (ref 0–200)
HDL: 93.4 mg/dL (ref >39.00)
LDL Cholesterol: 94 mg/dL (ref 0–99)
NonHDL: 112.8
Total CHOL/HDL Ratio: 2
Triglycerides: 92 mg/dL (ref 0.0–149.0)
VLDL: 18.4 mg/dL (ref 0.0–40.0)

## 2019-12-03 LAB — CBC WITH DIFFERENTIAL/PLATELET
Basophils Absolute: 0.1 10*3/uL (ref 0.0–0.1)
Basophils Relative: 0.6 % (ref 0.0–3.0)
Eosinophils Absolute: 0.1 10*3/uL (ref 0.0–0.7)
Eosinophils Relative: 1 % (ref 0.0–5.0)
HCT: 45.7 % (ref 36.0–46.0)
Hemoglobin: 14.7 g/dL (ref 12.0–15.0)
Lymphocytes Relative: 27.5 % (ref 12.0–46.0)
Lymphs Abs: 3.1 10*3/uL (ref 0.7–4.0)
MCHC: 32 g/dL (ref 30.0–36.0)
MCV: 82.1 fl (ref 78.0–100.0)
Monocytes Absolute: 0.6 10*3/uL (ref 0.1–1.0)
Monocytes Relative: 5 % (ref 3.0–12.0)
Neutro Abs: 7.4 10*3/uL (ref 1.4–7.7)
Neutrophils Relative %: 65.9 % (ref 43.0–77.0)
Platelets: 235 10*3/uL (ref 150.0–400.0)
RBC: 5.57 Mil/uL — ABNORMAL HIGH (ref 3.87–5.11)
RDW: 15.4 % (ref 11.5–15.5)
WBC: 11.2 10*3/uL — ABNORMAL HIGH (ref 4.0–10.5)

## 2019-12-03 LAB — URINALYSIS, ROUTINE W REFLEX MICROSCOPIC
Bilirubin Urine: NEGATIVE
Ketones, ur: 15 — AB
Leukocytes,Ua: NEGATIVE
Nitrite: NEGATIVE
RBC / HPF: NONE SEEN (ref 0–?)
Specific Gravity, Urine: >=1.03 — AB (ref 1.000–1.030)
Total Protein, Urine: NEGATIVE
Urine Glucose: NEGATIVE
Urobilinogen, UA: 0.2 (ref 0.0–1.0)
WBC, UA: NONE SEEN (ref 0–?)
pH: 5.5 (ref 5.0–8.0)

## 2019-12-03 LAB — TSH: TSH: 0.95 u[IU]/mL (ref 0.35–4.50)

## 2019-12-03 LAB — HEPATIC FUNCTION PANEL
ALT: 24 U/L (ref 0–35)
AST: 24 U/L (ref 0–37)
Albumin: 4.4 g/dL (ref 3.5–5.2)
Alkaline Phosphatase: 89 U/L (ref 39–117)
Bilirubin, Direct: 0.1 mg/dL (ref 0.0–0.3)
Total Bilirubin: 0.6 mg/dL (ref 0.2–1.2)
Total Protein: 7.5 g/dL (ref 6.0–8.3)

## 2019-12-03 LAB — HEMOGLOBIN A1C: Hgb A1c MFr Bld: 6.1 % (ref 4.6–6.5)

## 2019-12-03 LAB — VITAMIN D 25 HYDROXY (VIT D DEFICIENCY, FRACTURES): VITD: 16.83 ng/mL — ABNORMAL LOW (ref 30.00–100.00)

## 2019-12-03 LAB — VITAMIN B12: Vitamin B-12: 831 pg/mL (ref 211–911)

## 2019-12-03 MED ORDER — VITAMIN D (ERGOCALCIFEROL) 1.25 MG (50000 UNIT) PO CAPS: 50000.0000 [IU] | ORAL_CAPSULE | ORAL | 0 refills | Status: DC

## 2019-12-03 NOTE — Assessment & Plan Note (Signed)

## 2019-12-03 NOTE — Assessment & Plan Note (Signed)
stable overall by history and exam, recent data reviewed with pt, and pt to continue medical treatment as before,  to f/u any worsening symptoms or concerns  

## 2019-12-03 NOTE — Assessment & Plan Note (Signed)
For f/u bp at home and let us know if ave > 140/90

## 2019-12-03 NOTE — Progress Notes (Signed)
Subjective:    Patient ID: Jennifer Hobbs, female    DOB: 30-Sep-1950, 69 y.o.   MRN: [id number]  HPI  Here for wellness and f/u;  Overall doing ok;  Pt denies Chest pain, worsening SOB, DOE, wheezing, orthopnea, PND, worsening LE edema, palpitations, dizziness or syncope.  Pt denies neurological change such as new headache, facial or extremity weakness.  Pt denies polydipsia, polyuria, or low sugar symptoms. Pt states overall good compliance with treatment and medications, good tolerability, and has been trying to follow appropriate diet.  Pt denies worsening depressive symptoms, suicidal ideation or panic. No fever, night sweats, wt loss, loss of appetite, or other constitutional symptoms.  Pt states good ability with ADL's, has low fall risk, home safety reviewed and adequate, no other significant changes in hearing or vision, and only occasionally active with exercise. Plans to have mammogram soon per her GYN facility BP has trended up it seems with wt? Wt up with less active with covid. BP Readings from Last 3 Encounters:  12/03/19 (!) 160/90  12/01/18 (!) 144/94  11/29/17 138/90   Wt Readings from Last 3 Encounters:  12/03/19 182 lb (82.6 kg)  12/01/18 183 lb (83 kg)  11/29/17 170 lb (77.1 kg)   Past Medical History:  Diagnosis Date  . Allergy   . DDD (degenerative disc disease), lumbar   . Degenerative disc disease, lumbar 07/10/2015   Past Surgical History:  Procedure Laterality Date  . UTERINE FIBROID SURGERY      reports that she quit smoking about 10 years ago. She has never used smokeless tobacco. She reports current alcohol use. She reports that she does not use drugs. family history includes Diabetes in her sister; Multiple sclerosis (age of onset: 34) in her daughter; Stroke in her brother. No Known Allergies Current Outpatient Medications on File Prior to Visit  Medication Sig Dispense Refill  . aspirin EC 81 MG tablet Take 1 tablet (81 mg total) by mouth daily.      . cyclobenzaprine (FLEXERIL) 5 MG tablet TAKE 1 TABLET BY MOUTH THREE TIMES A DAY AS NEEDED FOR MUSCLE SPASMS 30 tablet 1  . meloxicam (MOBIC) 15 MG tablet TAKE 1 TABLET BY MOUTH EVERY DAY 30 tablet 2  . Multiple Vitamin (MULTIVITAMIN) tablet Take 1 tablet by mouth daily.    . rosuvastatin (CRESTOR) 20 MG tablet TAKE 1 TABLET BY MOUTH EVERY DAY 90 tablet 3   No current facility-administered medications on file prior to visit.   Review of Systems All otherwise neg per pt    Objective:   Physical Exam BP (!) 160/90 (BP Location: Left Arm, Patient Position: Sitting, Cuff Size: Large)   Pulse 90   Temp 98.2 F (36.8 C) (Oral)   Ht 5\' 5"  (1.651 m)   Wt 182 lb (82.6 kg)   SpO2 98%   BMI 30.29 kg/m  VS noted,  Constitutional: Pt appears in NAD HENT: Head: NCAT.  Right Ear: External ear normal.  Left Ear: External ear normal.  Eyes: . Pupils are equal, round, and reactive to light. Conjunctivae and EOM are normal Nose: without d/c or deformity Neck: Neck supple. Gross normal ROM Cardiovascular: Normal rate and regular rhythm.   Pulmonary/Chest: Effort normal and breath sounds without rales or wheezing.  Abd:  Soft, NT, ND, + BS, no organomegaly Neurological: Pt is alert. At baseline orientation, motor grossly intact Skin: Skin is warm. No rashes, other new lesions, no LE edema Psychiatric: Pt behavior is normal without agitation  All otherwise neg per pt Lab Results  Component Value Date   WBC 11.2 (H) 12/03/2019   HGB 14.7 12/03/2019   HCT 45.7 12/03/2019   PLT 235.0 12/03/2019   GLUCOSE 98 12/03/2019   CHOL 206 (H) 12/03/2019   TRIG 92.0 12/03/2019   HDL 93.40 12/03/2019   LDLDIRECT 139.0 11/29/2017   LDLCALC 94 12/03/2019   ALT 24 12/03/2019   AST 24 12/03/2019   NA 140 12/03/2019   K 3.7 12/03/2019   CL 102 12/03/2019   CREATININE 0.63 12/03/2019   BUN 20 12/03/2019   CO2 32 12/03/2019   TSH 0.95 12/03/2019   HGBA1C 6.1 12/03/2019      Assessment & Plan:

## 2019-12-03 NOTE — Patient Instructions (Addendum)
Please make a nurse visit appt for the shingles shot if your insurance is covering  Please remember to have your mammogram done at the facility of your GYN  You had the flu shot today, and the Prevnar 13 pneumonia shot  Please check your BP every day for 10 days, and call the office with the average, as you may need BP medication; the better BP should be at least less than 140/90, but even better would be 130/80  Please remember to have your COVID booster shot at 6 months after your last shot  Please continue all other medications as before, and refills have been done if requested.  Please have the pharmacy call with any other refills you may need.  Please continue your efforts at being more active, low cholesterol diet, and weight control.  You are otherwise up to date with prevention measures today.  Please keep your appointments with your specialists as you may have planned  Please go to the LAB at the blood drawing area for the tests to be done  You will be contacted by phone if any changes need to be made immediately.  Otherwise, you will receive a letter about your results with an explanation, but please check with MyChart first.  Please remember to sign up for MyChart if you have not done so, as this will be important to you in the future with finding out test results, communicating by private email, and scheduling acute appointments online when needed.  Please make an Appointment to return in 6 months, or sooner if needed

## 2019-12-14 ENCOUNTER — Telehealth: Payer: Self-pay | Admitting: Internal Medicine

## 2019-12-14 NOTE — Telephone Encounter (Signed)
Patient calling to report blood pressure starting from October 19 to current  174/82 212/114 160/90 174/96 164/100 173/95 178/97 160/80 170/80 171/96

## 2019-12-14 NOTE — Telephone Encounter (Signed)
Sent to Dr. John. 

## 2019-12-16 MED ORDER — OLMESARTAN MEDOXOMIL 20 MG PO TABS: 20.0000 mg | ORAL_TABLET | Freq: Every day | ORAL | 3 refills | Status: DC

## 2019-12-16 NOTE — Telephone Encounter (Signed)
Ok to let pt know that this is what we suspected at her last visit, that her BP is moderately to severely high even at home  [facility] to start generic benicar 20 mg per day,   After the first 5 days of taking the medicaiton, please repeat the BP taking at home and let us know the results  Please also let us know if the benicar for some reason is not covered well with her insurance, as it can be changed to a similar

## 2019-12-16 NOTE — Addendum Note (Signed)
Addended by: Biagio Borg on: 12/16/2019 04:57 PM   Modules accepted: Orders

## 2019-12-18 NOTE — Telephone Encounter (Signed)
LDVM for pt of Dr. John's instructions.  

## 2019-12-29 ENCOUNTER — Other Ambulatory Visit: Payer: Self-pay | Admitting: Internal Medicine

## 2020-01-18 ENCOUNTER — Other Ambulatory Visit: Payer: Self-pay | Admitting: Internal Medicine

## 2020-01-18 NOTE — Telephone Encounter (Signed)
Please refill as per office routine med refill policy (all routine meds refilled for 3 mo or monthly per pt preference up to one year from last visit, then month to month grace period for 3 mo, then further med refills will have to be denied)  

## 2020-01-22 DIAGNOSIS — Z1231 Encounter for screening mammogram for malignant neoplasm of breast: Secondary | ICD-10-CM | POA: Diagnosis not present

## 2020-02-07 ENCOUNTER — Telehealth: Payer: Self-pay | Admitting: Internal Medicine

## 2020-02-07 NOTE — Telephone Encounter (Signed)
Patient called and said that she went to the dentist and her BP was too high for them to put her under and anesthetic. She was wanting to know if her bp medication needed to be adjusted. The medication is olmesartan (BENICAR) 20 MG tablet

## 2020-02-07 NOTE — Telephone Encounter (Signed)
What was her BP?

## 2020-02-07 NOTE — Telephone Encounter (Signed)
Sent to Dr. Ronnald Ramp as Dr. Jenny Reichmann is not in the office.

## 2020-02-11 ENCOUNTER — Other Ambulatory Visit: Payer: Self-pay | Admitting: Internal Medicine

## 2020-02-11 DIAGNOSIS — I10 Essential (primary) hypertension: Secondary | ICD-10-CM

## 2020-02-11 MED ORDER — TRIAMTERENE-HCTZ 37.5-25 MG PO CAPS: 1.0000 | ORAL_CAPSULE | Freq: Every day | ORAL | 0 refills | Status: DC

## 2020-02-11 NOTE — Telephone Encounter (Signed)
RX sent

## 2020-02-28 ENCOUNTER — Other Ambulatory Visit: Payer: Self-pay | Admitting: Internal Medicine

## 2020-03-21 ENCOUNTER — Other Ambulatory Visit: Payer: Self-pay | Admitting: Internal Medicine

## 2020-03-21 NOTE — Telephone Encounter (Signed)
Please change to OTC Vitamin D3 at 2000 units per day, indefinitely.  

## 2020-05-05 ENCOUNTER — Other Ambulatory Visit: Payer: Self-pay | Admitting: Internal Medicine

## 2020-05-05 DIAGNOSIS — I10 Essential (primary) hypertension: Secondary | ICD-10-CM

## 2020-06-03 ENCOUNTER — Ambulatory Visit: Payer: PPO | Admitting: Internal Medicine

## 2020-06-10 ENCOUNTER — Ambulatory Visit: Payer: PPO | Admitting: Internal Medicine

## 2020-06-11 ENCOUNTER — Encounter: Payer: Self-pay | Admitting: Internal Medicine

## 2020-06-11 ENCOUNTER — Other Ambulatory Visit: Payer: Self-pay

## 2020-06-11 ENCOUNTER — Ambulatory Visit (INDEPENDENT_AMBULATORY_CARE_PROVIDER_SITE_OTHER): Payer: PPO | Admitting: Internal Medicine

## 2020-06-11 VITALS — BP 160/92 | HR 81 | Temp 98.0°F | Ht 65.0 in | Wt 172.6 lb

## 2020-06-11 DIAGNOSIS — E559 Vitamin D deficiency, unspecified: Secondary | ICD-10-CM

## 2020-06-11 DIAGNOSIS — E785 Hyperlipidemia, unspecified: Secondary | ICD-10-CM

## 2020-06-11 DIAGNOSIS — R739 Hyperglycemia, unspecified: Secondary | ICD-10-CM | POA: Diagnosis not present

## 2020-06-11 DIAGNOSIS — E538 Deficiency of other specified B group vitamins: Secondary | ICD-10-CM

## 2020-06-11 DIAGNOSIS — I10 Essential (primary) hypertension: Secondary | ICD-10-CM | POA: Diagnosis not present

## 2020-06-11 DIAGNOSIS — Z0001 Encounter for general adult medical examination with abnormal findings: Secondary | ICD-10-CM

## 2020-06-11 LAB — CBC WITH DIFFERENTIAL/PLATELET
Basophils Absolute: 0.1 10*3/uL (ref 0.0–0.1)
Basophils Relative: 1.2 % (ref 0.0–3.0)
Eosinophils Absolute: 0.1 10*3/uL (ref 0.0–0.7)
Eosinophils Relative: 1.5 % (ref 0.0–5.0)
HCT: 35.2 % — ABNORMAL LOW (ref 36.0–46.0)
Hemoglobin: 11.3 g/dL — ABNORMAL LOW (ref 12.0–15.0)
Lymphocytes Relative: 33.1 % (ref 12.0–46.0)
Lymphs Abs: 2.9 10*3/uL (ref 0.7–4.0)
MCHC: 32.1 g/dL (ref 30.0–36.0)
MCV: 82.4 fl (ref 78.0–100.0)
Monocytes Absolute: 0.6 10*3/uL (ref 0.1–1.0)
Monocytes Relative: 7.1 % (ref 3.0–12.0)
Neutro Abs: 4.9 10*3/uL (ref 1.4–7.7)
Neutrophils Relative %: 57.1 % (ref 43.0–77.0)
Platelets: 326 10*3/uL (ref 150.0–400.0)
RBC: 4.27 Mil/uL (ref 3.87–5.11)
RDW: 17.8 % — ABNORMAL HIGH (ref 11.5–15.5)
WBC: 8.6 10*3/uL (ref 4.0–10.5)

## 2020-06-11 LAB — LIPID PANEL
Cholesterol: 239 mg/dL — ABNORMAL HIGH (ref 0–200)
HDL: 77.8 mg/dL (ref >39.00)
LDL Cholesterol: 134 mg/dL — ABNORMAL HIGH (ref 0–99)
NonHDL: 161.09
Total CHOL/HDL Ratio: 3
Triglycerides: 136 mg/dL (ref 0.0–149.0)
VLDL: 27.2 mg/dL (ref 0.0–40.0)

## 2020-06-11 LAB — URINALYSIS, ROUTINE W REFLEX MICROSCOPIC
Bilirubin Urine: NEGATIVE
Ketones, ur: NEGATIVE
Leukocytes,Ua: NEGATIVE
Nitrite: NEGATIVE
Specific Gravity, Urine: 1.025 (ref 1.000–1.030)
Total Protein, Urine: NEGATIVE
Urine Glucose: NEGATIVE
Urobilinogen, UA: 0.2 (ref 0.0–1.0)
pH: 5 (ref 5.0–8.0)

## 2020-06-11 LAB — HEPATIC FUNCTION PANEL
ALT: 15 U/L (ref 0–35)
AST: 21 U/L (ref 0–37)
Albumin: 4.1 g/dL (ref 3.5–5.2)
Alkaline Phosphatase: 77 U/L (ref 39–117)
Bilirubin, Direct: 0.1 mg/dL (ref 0.0–0.3)
Total Bilirubin: 0.5 mg/dL (ref 0.2–1.2)
Total Protein: 6.9 g/dL (ref 6.0–8.3)

## 2020-06-11 LAB — BASIC METABOLIC PANEL
BUN: 19 mg/dL (ref 6–23)
CO2: 26 mEq/L (ref 19–32)
Calcium: 9.9 mg/dL (ref 8.4–10.5)
Chloride: 106 mEq/L (ref 96–112)
Creatinine, Ser: 1.11 mg/dL (ref 0.40–1.20)
GFR: 51.42 mL/min — ABNORMAL LOW (ref >60.00)
Glucose, Bld: 84 mg/dL (ref 70–99)
Potassium: 3.8 mEq/L (ref 3.5–5.1)
Sodium: 142 mEq/L (ref 135–145)

## 2020-06-11 LAB — VITAMIN D 25 HYDROXY (VIT D DEFICIENCY, FRACTURES): VITD: 19.43 ng/mL — ABNORMAL LOW (ref 30.00–100.00)

## 2020-06-11 LAB — VITAMIN B12: Vitamin B-12: 798 pg/mL (ref 211–911)

## 2020-06-11 LAB — TSH: TSH: 1.25 u[IU]/mL (ref 0.35–4.50)

## 2020-06-11 LAB — HEMOGLOBIN A1C: Hgb A1c MFr Bld: 5.6 % (ref 4.6–6.5)

## 2020-06-11 MED ORDER — TRIAMTERENE-HCTZ 37.5-25 MG PO CAPS: 1.0000 | ORAL_CAPSULE | Freq: Every day | ORAL | 3 refills | Status: DC

## 2020-06-11 MED ORDER — CYCLOBENZAPRINE HCL 5 MG PO TABS: ORAL_TABLET | ORAL | 3 refills | Status: DC

## 2020-06-11 NOTE — Progress Notes (Signed)
Patient ID: Jennifer Hobbs, female   DOB: 09/04/1950, 70 y.o.   MRN: [id number]         Chief Complaint:: wellness exam and Follow-up  low vit d, and htn       HPI:  Jennifer Hobbs is a 70 y.o. female here for wellness exam; up to date with preventive referrals and immunizations                        Also grieving for sister died last wk of cancer not sure what kind, father and brother also have prostate ca.  Has mild worsening depressive symptoms, but no suicidal ideation, or panic; has ongoing anxiety, not increased recently.  Ran out of BP med but willing to restart.  Pt denies chest pain, increased sob or doe, wheezing, orthopnea, PND, increased LE swelling, palpitations, dizziness or syncope.   Pt denies polydipsia, polyuria, or new focal neuro s/s.  Not taking Vit d.   Pt denies fever, wt loss, night sweats, loss of appetite, or other constitutional symptoms  No other new complaints.  BP at home have been < 140;90 but not checked recently.     Wt Readings from Last 3 Encounters:  06/11/20 172 lb 9.6 oz (78.3 kg)  12/03/19 182 lb (82.6 kg)  12/01/18 183 lb (83 kg)   BP Readings from Last 3 Encounters:  06/11/20 (!) 160/92  12/03/19 (!) 160/90  12/01/18 (!) 144/94   Immunization History  Administered Date(s) Administered  . Fluad Quad(high Dose 65+) 12/01/2018, 12/03/2019  . Influenza, High Dose Seasonal PF 11/29/2017  . PFIZER(Purple Top)SARS-COV-2 Vaccination 04/12/2019, 05/08/2019, 12/27/2019  . Pneumococcal Conjugate-13 12/03/2019  . Pneumococcal Polysaccharide-23 12/01/2018  . Tdap 12/01/2018  There are no preventive care reminders to display for this patient.    Past Medical History:  Diagnosis Date  . Allergy   . DDD (degenerative disc disease), lumbar   . Degenerative disc disease, lumbar 07/10/2015   Past Surgical History:  Procedure Laterality Date  . UTERINE FIBROID SURGERY      reports that she quit smoking about 11 years ago. She has never used smokeless  tobacco. She reports current alcohol use. She reports that she does not use drugs. family history includes Diabetes in her sister; Multiple sclerosis (age of onset: 31) in her daughter; Stroke in her brother. No Known Allergies Current Outpatient Medications on File Prior to Visit  Medication Sig Dispense Refill  . aspirin EC 81 MG tablet Take 1 tablet (81 mg total) by mouth daily.    . meloxicam (MOBIC) 15 MG tablet TAKE 1 TABLET BY MOUTH EVERY DAY 90 tablet 1  . Multiple Vitamin (MULTIVITAMIN) tablet Take 1 tablet by mouth daily.    Marland Kitchen olmesartan (BENICAR) 20 MG tablet Take 1 tablet (20 mg total) by mouth daily. 90 tablet 3  . rosuvastatin (CRESTOR) 20 MG tablet TAKE 1 TABLET BY MOUTH EVERY DAY 90 tablet 3   No current facility-administered medications on file prior to visit.        ROS:  All others reviewed and negative.  Objective        PE:  BP (!) 160/92 (BP Location: Left Arm, Patient Position: Sitting, Cuff Size: Large)   Pulse 81   Temp 98 F (36.7 C)   Ht 5\' 5"  (1.651 m)   Wt 172 lb 9.6 oz (78.3 kg)   SpO2 99%   BMI 28.72 kg/m  Constitutional: Pt appears in NAD               HENT: Head: NCAT.                Right Ear: External ear normal.                 Left Ear: External ear normal.                Eyes: . Pupils are equal, round, and reactive to light. Conjunctivae and EOM are normal               Nose: without d/c or deformity               Neck: Neck supple. Gross normal ROM               Cardiovascular: Normal rate and regular rhythm.                 Pulmonary/Chest: Effort normal and breath sounds without rales or wheezing.                Abd:  Soft, NT, ND, + BS, no organomegaly               Neurological: Pt is alert. At baseline orientation, motor grossly intact               Skin: Skin is warm. No rashes, no other new lesions, LE edema - none               Psychiatric: Pt behavior is normal without agitation   Micro: none  Cardiac tracings  I have personally interpreted today:  none  Pertinent Radiological findings (summarize): none   Lab Results  Component Value Date   WBC 8.6 06/11/2020   HGB 11.3 (L) 06/11/2020   HCT 35.2 (L) 06/11/2020   PLT 326.0 06/11/2020   GLUCOSE 84 06/11/2020   CHOL 239 (H) 06/11/2020   TRIG 136.0 06/11/2020   HDL 77.80 06/11/2020   LDLDIRECT 139.0 11/29/2017   LDLCALC 134 (H) 06/11/2020   ALT 15 06/11/2020   AST 21 06/11/2020   NA 142 06/11/2020   K 3.8 06/11/2020   CL 106 06/11/2020   CREATININE 1.11 06/11/2020   BUN 19 06/11/2020   CO2 26 06/11/2020   TSH 1.25 06/11/2020   HGBA1C 5.6 06/11/2020   Assessment/Plan:  Jennifer Hobbs is a 70 y.o. Black or African American [2] female with  has a past medical history of Allergy, DDD (degenerative disc disease), lumbar, and Degenerative disc disease, lumbar (07/10/2015).  Encounter for well adult exam with abnormal findings Age and sex appropriate education and counseling updated with regular exercise and diet Referrals for preventative services - none needed Immunizations addressed - none needed Smoking counseling  - none needed Evidence for depression or other mood disorder - none significant Most recent labs reviewed. I have personally reviewed and have noted: 1) the patient's medical and social history 2) The patient's current medications and supplements 3) The patient's height, weight, and BMI have been recorded in the chart   Hyperlipidemia Lab Results  Component Value Date   LDLCALC 134 (H) 06/11/2020   uncontrolled, pt to restart current statin crestor 20   Hyperglycemia Lab Results  Component Value Date   HGBA1C 5.6 06/11/2020   Stable, pt to continue current medical treatment - diet   Hypertension, uncontrolled BP Readings from Last 3 Encounters:  06/11/20 (!) 160/92  12/03/19 Marland Kitchen)  160/90  12/01/18 (!) 144/94   uncontrolled pt to restart medical treatment  - benicar, dyazide   Current Outpatient  Medications (Cardiovascular):  .  olmesartan (BENICAR) 20 MG tablet, Take 1 tablet (20 mg total) by mouth daily. .  rosuvastatin (CRESTOR) 20 MG tablet, TAKE 1 TABLET BY MOUTH EVERY DAY .  triamterene-hydrochlorothiazide (DYAZIDE) 37.5-25 MG capsule, Take 1 each (1 capsule total) by mouth daily.   Current Outpatient Medications (Analgesics):  .  aspirin EC 81 MG tablet, Take 1 tablet (81 mg total) by mouth daily. .  meloxicam (MOBIC) 15 MG tablet, TAKE 1 TABLET BY MOUTH EVERY DAY   Current Outpatient Medications (Other):  Marland Kitchen  Multiple Vitamin (MULTIVITAMIN) tablet, Take 1 tablet by mouth daily. .  cyclobenzaprine (FLEXERIL) 5 MG tablet, TAKE 1 TABLET BY MOUTH THREE TIMES A DAY AS NEEDED FOR MUSCLE SPASMS   Vitamin D deficiency Last vitamin D Lab Results  Component Value Date   VD25OH 19.43 (L) 06/11/2020   Low, to start oral replacement - 2000 units   Followup: Return in about 1 year (around 06/11/2021).  Cathlean Cower, MD 06/15/2020 5:27 PM [facility]

## 2020-06-11 NOTE — Patient Instructions (Signed)
Please take OTC Vitamin D3 at 2000 units per day, indefinitely.  Please continue all other medications as before, including to restart the fluid pill for blood pressure  Please continue to monitor your BP at home with a goal to be less than 140/90 (though less thant 130/80 would be better)  Please have the pharmacy call with any other refills you may need.  Please continue your efforts at being more active, low cholesterol diet, and weight control.  You are otherwise up to date with prevention measures today.  Please keep your appointments with your specialists as you may have planned  Please go to the LAB at the blood drawing area for the tests to be done  You will be contacted by phone if any changes need to be made immediately.  Otherwise, you will receive a letter about your results with an explanation, but please check with MyChart first.  Please remember to sign up for MyChart if you have not done so, as this will be important to you in the future with finding out test results, communicating by private email, and scheduling acute appointments online when needed.  Please make an Appointment to return for your 1 year visit, or sooner if needed

## 2020-06-12 ENCOUNTER — Encounter: Payer: Self-pay | Admitting: Internal Medicine

## 2020-06-15 ENCOUNTER — Encounter: Payer: Self-pay | Admitting: Internal Medicine

## 2020-06-15 DIAGNOSIS — E559 Vitamin D deficiency, unspecified: Secondary | ICD-10-CM | POA: Insufficient documentation

## 2020-06-15 MED ORDER — ROSUVASTATIN CALCIUM 20 MG PO TABS: 20.0000 mg | ORAL_TABLET | Freq: Every day | ORAL | 3 refills | Status: DC

## 2020-06-15 MED ORDER — OLMESARTAN MEDOXOMIL 20 MG PO TABS: 20.0000 mg | ORAL_TABLET | Freq: Every day | ORAL | 3 refills | Status: DC

## 2020-06-15 NOTE — Addendum Note (Signed)
Addended by: Biagio Borg on: 06/15/2020 05:29 PM   Modules accepted: Orders

## 2020-06-15 NOTE — Assessment & Plan Note (Addendum)
Last vitamin D Lab Results  Component Value Date   VD25OH 19.43 (L) 06/11/2020   Low, to start oral replacement - 2000 units

## 2020-06-15 NOTE — Assessment & Plan Note (Signed)

## 2020-06-15 NOTE — Assessment & Plan Note (Signed)
BP Readings from Last 3 Encounters:  06/11/20 (!) 160/92  12/03/19 (!) 160/90  12/01/18 (!) 144/94   uncontrolled pt to restart medical treatment  - benicar, dyazide   Current Outpatient Medications (Cardiovascular):  .  olmesartan (BENICAR) 20 MG tablet, Take 1 tablet (20 mg total) by mouth daily. .  rosuvastatin (CRESTOR) 20 MG tablet, TAKE 1 TABLET BY MOUTH EVERY DAY .  triamterene-hydrochlorothiazide (DYAZIDE) 37.5-25 MG capsule, Take 1 each (1 capsule total) by mouth daily.   Current Outpatient Medications (Analgesics):  .  aspirin EC 81 MG tablet, Take 1 tablet (81 mg total) by mouth daily. .  meloxicam (MOBIC) 15 MG tablet, TAKE 1 TABLET BY MOUTH EVERY DAY   Current Outpatient Medications (Other):  Marland Kitchen  Multiple Vitamin (MULTIVITAMIN) tablet, Take 1 tablet by mouth daily. .  cyclobenzaprine (FLEXERIL) 5 MG tablet, TAKE 1 TABLET BY MOUTH THREE TIMES A DAY AS NEEDED FOR MUSCLE SPASMS

## 2020-06-15 NOTE — Assessment & Plan Note (Addendum)
Lab Results  Component Value Date   LDLCALC 134 (H) 06/11/2020   uncontrolled, pt to restart current statin crestor 20

## 2020-06-15 NOTE — Assessment & Plan Note (Signed)
Lab Results  Component Value Date   HGBA1C 5.6 06/11/2020   Stable, pt to continue current medical treatment - diet

## 2020-06-23 ENCOUNTER — Other Ambulatory Visit: Payer: Self-pay | Admitting: Internal Medicine

## 2020-10-03 DIAGNOSIS — H524 Presbyopia: Secondary | ICD-10-CM | POA: Diagnosis not present

## 2020-10-13 ENCOUNTER — Telehealth: Payer: Self-pay | Admitting: Internal Medicine

## 2020-10-13 NOTE — Telephone Encounter (Signed)
Left message for patient to call me back at [phone] to schedule Medicare Annual Wellness Visit   No hx of AWV eligible as of 11/16/18  Please schedule at anytime with [facility] Advisor if patient calls the office back.    40 Minutes appointment   Any questions, please call me at [phone]

## 2020-10-21 ENCOUNTER — Ambulatory Visit: Payer: PPO

## 2020-12-15 ENCOUNTER — Other Ambulatory Visit: Payer: Self-pay | Admitting: Internal Medicine

## 2021-01-26 DIAGNOSIS — Z1231 Encounter for screening mammogram for malignant neoplasm of breast: Secondary | ICD-10-CM | POA: Diagnosis not present

## 2021-01-26 LAB — HM MAMMOGRAPHY

## 2021-04-21 ENCOUNTER — Encounter: Payer: Self-pay | Admitting: Gastroenterology

## 2021-05-29 ENCOUNTER — Other Ambulatory Visit: Payer: Self-pay | Admitting: Internal Medicine

## 2021-06-10 ENCOUNTER — Other Ambulatory Visit: Payer: Self-pay | Admitting: Internal Medicine

## 2021-06-10 DIAGNOSIS — I10 Essential (primary) hypertension: Secondary | ICD-10-CM

## 2021-06-11 ENCOUNTER — Encounter: Payer: Self-pay | Admitting: Internal Medicine

## 2021-06-11 ENCOUNTER — Ambulatory Visit (INDEPENDENT_AMBULATORY_CARE_PROVIDER_SITE_OTHER): Payer: PPO | Admitting: Internal Medicine

## 2021-06-11 VITALS — BP 140/72 | HR 92 | Temp 98.3°F | Ht 65.0 in | Wt 179.0 lb

## 2021-06-11 DIAGNOSIS — Z0000 Encounter for general adult medical examination without abnormal findings: Secondary | ICD-10-CM | POA: Diagnosis not present

## 2021-06-11 DIAGNOSIS — E559 Vitamin D deficiency, unspecified: Secondary | ICD-10-CM

## 2021-06-11 DIAGNOSIS — E538 Deficiency of other specified B group vitamins: Secondary | ICD-10-CM | POA: Diagnosis not present

## 2021-06-11 DIAGNOSIS — D649 Anemia, unspecified: Secondary | ICD-10-CM

## 2021-06-11 DIAGNOSIS — I10 Essential (primary) hypertension: Secondary | ICD-10-CM

## 2021-06-11 DIAGNOSIS — R739 Hyperglycemia, unspecified: Secondary | ICD-10-CM | POA: Diagnosis not present

## 2021-06-11 DIAGNOSIS — E785 Hyperlipidemia, unspecified: Secondary | ICD-10-CM | POA: Diagnosis not present

## 2021-06-11 DIAGNOSIS — Z1211 Encounter for screening for malignant neoplasm of colon: Secondary | ICD-10-CM

## 2021-06-11 DIAGNOSIS — Z0001 Encounter for general adult medical examination with abnormal findings: Secondary | ICD-10-CM

## 2021-06-11 LAB — BASIC METABOLIC PANEL
BUN: 28 mg/dL — ABNORMAL HIGH (ref 6–23)
CO2: 29 mEq/L (ref 19–32)
Calcium: 10 mg/dL (ref 8.4–10.5)
Chloride: 102 mEq/L (ref 96–112)
Creatinine, Ser: 1.36 mg/dL — ABNORMAL HIGH (ref 0.40–1.20)
GFR: 40.02 mL/min — ABNORMAL LOW (ref >60.00)
Glucose, Bld: 97 mg/dL (ref 70–99)
Potassium: 3.8 mEq/L (ref 3.5–5.1)
Sodium: 139 mEq/L (ref 135–145)

## 2021-06-11 LAB — HEPATIC FUNCTION PANEL
ALT: 18 U/L (ref 0–35)
AST: 23 U/L (ref 0–37)
Albumin: 4.6 g/dL (ref 3.5–5.2)
Alkaline Phosphatase: 70 U/L (ref 39–117)
Bilirubin, Direct: 0.1 mg/dL (ref 0.0–0.3)
Total Bilirubin: 0.5 mg/dL (ref 0.2–1.2)
Total Protein: 7.4 g/dL (ref 6.0–8.3)

## 2021-06-11 LAB — URINALYSIS, ROUTINE W REFLEX MICROSCOPIC
Bilirubin Urine: NEGATIVE
Hgb urine dipstick: NEGATIVE
Ketones, ur: NEGATIVE
Leukocytes,Ua: NEGATIVE
Nitrite: NEGATIVE
Specific Gravity, Urine: 1.01 (ref 1.000–1.030)
Total Protein, Urine: NEGATIVE
Urine Glucose: NEGATIVE
Urobilinogen, UA: 0.2 (ref 0.0–1.0)
pH: 6 (ref 5.0–8.0)

## 2021-06-11 LAB — IBC PANEL
Iron: 65 ug/dL (ref 42–145)
Saturation Ratios: 18.1 % — ABNORMAL LOW (ref 20.0–50.0)
TIBC: 359.8 ug/dL (ref 250.0–450.0)
Transferrin: 257 mg/dL (ref 212.0–360.0)

## 2021-06-11 LAB — FERRITIN: Ferritin: 147 ng/mL (ref 10.0–291.0)

## 2021-06-11 LAB — CBC WITH DIFFERENTIAL/PLATELET
Basophils Absolute: 0.1 10*3/uL (ref 0.0–0.1)
Basophils Relative: 1.5 % (ref 0.0–3.0)
Eosinophils Absolute: 0.1 10*3/uL (ref 0.0–0.7)
Eosinophils Relative: 1.7 % (ref 0.0–5.0)
HCT: 37.5 % (ref 36.0–46.0)
Hemoglobin: 11.8 g/dL — ABNORMAL LOW (ref 12.0–15.0)
Lymphocytes Relative: 40.1 % (ref 12.0–46.0)
Lymphs Abs: 3.2 10*3/uL (ref 0.7–4.0)
MCHC: 31.5 g/dL (ref 30.0–36.0)
MCV: 81.6 fl (ref 78.0–100.0)
Monocytes Absolute: 0.4 10*3/uL (ref 0.1–1.0)
Monocytes Relative: 5.4 % (ref 3.0–12.0)
Neutro Abs: 4.1 10*3/uL (ref 1.4–7.7)
Neutrophils Relative %: 51.3 % (ref 43.0–77.0)
Platelets: 277 10*3/uL (ref 150.0–400.0)
RBC: 4.6 Mil/uL (ref 3.87–5.11)
RDW: 14.8 % (ref 11.5–15.5)
WBC: 8 10*3/uL (ref 4.0–10.5)

## 2021-06-11 LAB — LIPID PANEL
Cholesterol: 229 mg/dL — ABNORMAL HIGH (ref 0–200)
HDL: 90.8 mg/dL (ref >39.00)
LDL Cholesterol: 114 mg/dL — ABNORMAL HIGH (ref 0–99)
NonHDL: 137.73
Total CHOL/HDL Ratio: 3
Triglycerides: 117 mg/dL (ref 0.0–149.0)
VLDL: 23.4 mg/dL (ref 0.0–40.0)

## 2021-06-11 LAB — VITAMIN D 25 HYDROXY (VIT D DEFICIENCY, FRACTURES): VITD: 37.05 ng/mL (ref 30.00–100.00)

## 2021-06-11 LAB — TSH: TSH: 1.42 u[IU]/mL (ref 0.35–5.50)

## 2021-06-11 LAB — HEMOGLOBIN A1C: Hgb A1c MFr Bld: 6 % (ref 4.6–6.5)

## 2021-06-11 LAB — VITAMIN B12: Vitamin B-12: 780 pg/mL (ref 211–911)

## 2021-06-11 MED ORDER — CYCLOBENZAPRINE HCL 5 MG PO TABS: ORAL_TABLET | ORAL | 3 refills | Status: DC

## 2021-06-11 MED ORDER — OLMESARTAN MEDOXOMIL 40 MG PO TABS: 40.0000 mg | ORAL_TABLET | Freq: Every day | ORAL | 3 refills | Status: DC

## 2021-06-11 MED ORDER — ROSUVASTATIN CALCIUM 20 MG PO TABS: 20.0000 mg | ORAL_TABLET | Freq: Every day | ORAL | 3 refills | Status: DC

## 2021-06-11 MED ORDER — MELOXICAM 15 MG PO TABS: 15.0000 mg | ORAL_TABLET | Freq: Every day | ORAL | 3 refills | Status: DC

## 2021-06-11 MED ORDER — TRIAMTERENE-HCTZ 37.5-25 MG PO CAPS: ORAL_CAPSULE | ORAL | 3 refills | Status: DC

## 2021-06-11 NOTE — Progress Notes (Signed)
Patient ID: Jennifer Hobbs, female   DOB: May 07, 1950, 71 y.o.   MRN: [id number] ? ? ? ?     Chief Complaint:: wellness exam and Annual Exam (Patient c/o having back pain) ? , htn, low vit d, hld, anemia ? ?     HPI:  Jennifer Hobbs is a 71 y.o. female here for wellness exam; due for colonoscopy, declines shingrix, o/w up to date ?         ?              Also BP at home similar to today.  Not Taking Vit d.  Now taking Crestor 20.  No overt bleeding.  Pt continues to have recurring LBP without change in severity, bowel or bladder change, fever, wt loss,  worsening LE pain/numbness/weakness, gait change or falls - asks for handicapped parking application.  Pt denies chest pain, increased sob or doe, wheezing, orthopnea, PND, increased LE swelling, palpitations, dizziness or syncope.   Pt denies polydipsia, polyuria, or new focal neuro s/s.  Pt denies fever, wt loss, night sweats, loss of appetite, or other constitutional symptoms  ?  ?Wt Readings from Last 3 Encounters:  ?06/11/21 179 lb (81.2 kg)  ?06/11/20 172 lb 9.6 oz (78.3 kg)  ?12/03/19 182 lb (82.6 kg)  ? ?BP Readings from Last 3 Encounters:  ?06/11/21 140/72  ?06/11/20 (!) 160/92  ?12/03/19 (!) 160/90  ? ?Immunization History  ?Administered Date(s) Administered  ? Fluad Quad(high Dose 65+) 12/01/2018, 12/03/2019  ? Influenza, High Dose Seasonal PF 11/29/2017  ? PFIZER(Purple Top)SARS-COV-2 Vaccination 04/12/2019, 05/08/2019, 12/27/2019  ? Pneumococcal Conjugate-13 12/03/2019  ? Pneumococcal Polysaccharide-23 12/01/2018  ? Tdap 12/01/2018  ? ?Health Maintenance Due  ?Topic Date Due  ? COLONOSCOPY (Pts 45-102yr Insurance coverage will need to be confirmed)  02/24/2021  ? ?  ? ?Past Medical History:  ?Diagnosis Date  ? Allergy   ? DDD (degenerative disc disease), lumbar   ? Degenerative disc disease, lumbar 07/10/2015  ? ?Past Surgical History:  ?Procedure Laterality Date  ? UTERINE FIBROID SURGERY    ? ? reports that she quit smoking about 12 years ago. Her  smoking use included cigarettes. She has never used smokeless tobacco. She reports current alcohol use. She reports that she does not use drugs. ?family history includes Diabetes in her sister; Multiple sclerosis (age of onset: 250 in her daughter; Stroke in her brother. ?No Known Allergies ?Current Outpatient Medications on File Prior to Visit  ?Medication Sig Dispense Refill  ? aspirin EC 81 MG tablet Take 1 tablet (81 mg total) by mouth daily.    ? Multiple Vitamin (MULTIVITAMIN) tablet Take 1 tablet by mouth daily.    ? ?No current facility-administered medications on file prior to visit.  ? ?     ROS:  All others reviewed and negative. ? ?Objective  ? ?     PE:  BP 140/72 (BP Location: Right Arm, Patient Position: Sitting, Cuff Size: Large)   Pulse 92   Temp 98.3 ?F (36.8 ?C) (Oral)   Ht '5\' 5"'$  (1.651 m)   Wt 179 lb (81.2 kg)   SpO2 100%   BMI 29.79 kg/m?  ? ?              Constitutional: Pt appears in NAD ?              HENT: Head: NCAT.  ?              Right Ear:  External ear normal.   ?              Left Ear: External ear normal.  ?              Eyes: . Pupils are equal, round, and reactive to light. Conjunctivae and EOM are normal ?              Nose: without d/c or deformity ?              Neck: Neck supple. Gross normal ROM ?              Cardiovascular: Normal rate and regular rhythm.   ?              Pulmonary/Chest: Effort normal and breath sounds without rales or wheezing.  ?              Abd:  Soft, NT, ND, + BS, no organomegaly ?              Neurological: Pt is alert. At baseline orientation, motor grossly intact ?              Skin: Skin is warm. No rashes, no other new lesions, LE edema - none ?              Psychiatric: Pt behavior is normal without agitation  ? ?Micro: none ? ?Cardiac tracings I have personally interpreted today:  none ? ?Pertinent Radiological findings (summarize): none  ? ?Lab Results  ?Component Value Date  ? WBC 8.0 06/11/2021  ? HGB 11.8 (L) 06/11/2021  ? HCT 37.5  06/11/2021  ? PLT 277.0 06/11/2021  ? GLUCOSE 97 06/11/2021  ? CHOL 229 (H) 06/11/2021  ? TRIG 117.0 06/11/2021  ? HDL 90.80 06/11/2021  ? LDLDIRECT 139.0 11/29/2017  ? LDLCALC 114 (H) 06/11/2021  ? ALT 18 06/11/2021  ? AST 23 06/11/2021  ? NA 139 06/11/2021  ? K 3.8 06/11/2021  ? CL 102 06/11/2021  ? CREATININE 1.36 (H) 06/11/2021  ? BUN 28 (H) 06/11/2021  ? CO2 29 06/11/2021  ? TSH 1.42 06/11/2021  ? HGBA1C 6.0 06/11/2021  ? ?Assessment/Plan:  ?Jennifer Hobbs is a 71 y.o. Black or African American [2] female with  has a past medical history of Allergy, DDD (degenerative disc disease), lumbar, and Degenerative disc disease, lumbar (07/10/2015). ? ?Vitamin D deficiency ?Last vitamin D ?Lab Results  ?Component Value Date  ? VD25OH 19.43 (L) 06/11/2020  ? ?Low, to start oral replacement ? ? ?Preventative health care ?Age and sex appropriate education and counseling updated with regular exercise and diet ?Referrals for preventative services - due for colonoscopy ?Immunizations addressed - declines shingrix ?Smoking counseling  - none needed ?Evidence for depression or other mood disorder - none significant ?Most recent labs reviewed. ?I have personally reviewed and have noted: ?1) the patient's medical and social history ?2) The patient's current medications and supplements ?3) The patient's height, weight, and BMI have been recorded in the chart ? ? ?Hypertension, uncontrolled ?Uncontrolled, to increase the benicar 40 qd ? ?Hyperlipidemia ?Lab Results  ?Component Value Date  ? LDLCALC 114 (H) 06/11/2021  ? ?Uncontrolled, goal ldl < 100, pt to continue current crestor 20, decliens change for now ? ? ?Hyperglycemia ?Lab Results  ?Component Value Date  ? HGBA1C 6.0 06/11/2021  ? ?Stable, pt to continue current medical treatment  - diet ? ?Followup: Return in about 6 months (around 12/11/2021). ? ?Cathlean Cower, MD 06/15/2021  7:01 PM ?[facility] ?[place] ?Internal Medicine ?

## 2021-06-11 NOTE — Patient Instructions (Signed)
You are given the parking application form today ? ?Ok to increase the olmesartan (benicar) to 40 mg per day ? ?Please continue all other medications as before, including the Vitamin D ? ?Please have the pharmacy call with any other refills you may need. ? ?Please continue your efforts at being more active, low cholesterol diet, and weight control. ? ?You are otherwise up to date with prevention measures today. ? ?Please keep your appointments with your specialists as you may have planned ? ?You will be contacted regarding the referral for: colonoscopy ? ?Please go to the LAB at the blood drawing area for the tests to be done ? ?You will be contacted by phone if any changes need to be made immediately.  Otherwise, you will receive a letter about your results with an explanation, but please check with MyChart first. ? ?Please remember to sign up for MyChart if you have not done so, as this will be important to you in the future with finding out test results, communicating by private email, and scheduling acute appointments online when needed. ? ?Please make an Appointment to return in 6 months, or sooner if needed ?

## 2021-06-11 NOTE — Assessment & Plan Note (Signed)
Last vitamin D ?Lab Results  ?Component Value Date  ? VD25OH 19.43 (L) 06/11/2020  ? ?Low, to start oral replacement ? ?

## 2021-06-15 ENCOUNTER — Encounter: Payer: Self-pay | Admitting: Internal Medicine

## 2021-06-15 NOTE — Assessment & Plan Note (Signed)
Age and sex appropriate education and counseling updated with regular exercise and diet ?Referrals for preventative services - due for colonoscopy ?Immunizations addressed - declines shingrix ?Smoking counseling  - none needed ?Evidence for depression or other mood disorder - none significant ?Most recent labs reviewed. ?I have personally reviewed and have noted: ?1) the patient's medical and social history ?2) The patient's current medications and supplements ?3) The patient's height, weight, and BMI have been recorded in the chart ? ?

## 2021-06-15 NOTE — Assessment & Plan Note (Signed)
Uncontrolled, to increase the benicar 40 qd ?

## 2021-06-15 NOTE — Assessment & Plan Note (Signed)
Lab Results  ?Component Value Date  ? LDLCALC 114 (H) 06/11/2021  ? ?Uncontrolled, goal ldl < 100, pt to continue current crestor 20, decliens change for now ? ?

## 2021-06-15 NOTE — Assessment & Plan Note (Signed)
Lab Results  ?Component Value Date  ? HGBA1C 6.0 06/11/2021  ? ?Stable, pt to continue current medical treatment  - diet ? ?

## 2021-11-20 ENCOUNTER — Ambulatory Visit (INDEPENDENT_AMBULATORY_CARE_PROVIDER_SITE_OTHER): Payer: PPO

## 2021-11-20 VITALS — Ht 67.0 in | Wt 178.0 lb

## 2021-11-20 DIAGNOSIS — Z0000 Encounter for general adult medical examination without abnormal findings: Secondary | ICD-10-CM | POA: Diagnosis not present

## 2021-11-20 DIAGNOSIS — Z1211 Encounter for screening for malignant neoplasm of colon: Secondary | ICD-10-CM | POA: Diagnosis not present

## 2021-11-20 NOTE — Progress Notes (Signed)
Subjective:   Jennifer Hobbs is a 71 y.o. female who presents for Medicare Annual (Subsequent) preventive examination.   Virtual Visit via Telephone Note  I connected with  RAGEN LAVER on 11/20/21 at  9:45 AM EDT by telephone and verified that I am speaking with the correct person using two identifiers.  Location: Patient: home  Provider: Paulla Fore  Persons participating in the virtual visit: patient/Nurse Health Advisor   I discussed the limitations, risks, security and privacy concerns of performing an evaluation and management service by telephone and the availability of in person appointments. The patient expressed understanding and agreed to proceed.  Interactive audio and video telecommunications were attempted between this nurse and patient, however failed, due to patient having technical difficulties OR patient did not have access to video capability.  We continued and completed visit with audio only.  Some vital signs may be absent or patient reported.   Daphane Shepherd, LPN  Review of Systems     Cardiac Risk Factors include: advanced age (>53mn, >>74women);hypertension     Objective:    Today's Vitals   11/20/21 0954  Weight: 178 lb (80.7 kg)  Height: '5\' 7"'$  (1.702 m)   Body mass index is 27.88 kg/m.     11/20/2021    9:59 AM 02/25/2016   10:37 AM 12/16/2015    8:25 AM 07/21/2015   10:56 AM  Advanced Directives  Does Patient Have a Medical Advance Directive? No No Yes No  Type of AScientist, [place]of [place] will   Would patient like information on creating a medical advance directive? No - Patient declined   Yes - Educational materials given    Current Medications (verified) Outpatient Encounter Medications as of 11/20/2021  Medication Sig   aspirin EC 81 MG tablet Take 1 tablet (81 mg total) by mouth daily.   cyclobenzaprine (FLEXERIL) 5 MG tablet 1 tab by mouth three times per day as needed   meloxicam (MOBIC) 15 MG  tablet Take 1 tablet (15 mg total) by mouth daily.   Multiple Vitamin (MULTIVITAMIN) tablet Take 1 tablet by mouth daily.   olmesartan (BENICAR) 40 MG tablet Take 1 tablet (40 mg total) by mouth daily.   rosuvastatin (CRESTOR) 20 MG tablet Take 1 tablet (20 mg total) by mouth daily.   triamterene-hydrochlorothiazide (DYAZIDE) 37.5-25 MG capsule TAKE 1 CAPSULE BY MOUTH EVERY DAY   No facility-administered encounter medications on file as of 11/20/2021.    Allergies (verified) Patient has no known allergies.   History: Past Medical History:  Diagnosis Date   Allergy    DDD (degenerative disc disease), lumbar    Degenerative disc disease, lumbar 07/10/2015   Past Surgical History:  Procedure Laterality Date   UTERINE FIBROID SURGERY     Family History  Problem Relation Age of Onset   Diabetes Sister    Stroke Brother    Multiple sclerosis Daughter 25   Colon cancer Neg Hx    Colon polyps Neg Hx    Rectal cancer Neg Hx    Stomach cancer Neg Hx    Social History   Socioeconomic History   Marital status: Married    Spouse name: CSybel Standish  Number of children: 1   Years of education: college   Highest education level: Not on file  Occupational History   Occupation: unemployed    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   Lives with  her husband.   Their daughter and grandson moved to [place], [place] 09/2013.    Social Determinants of Health   Financial Resource Strain: Low Risk  (11/20/2021)   Overall Financial Resource Strain (CARDIA)    Difficulty of Paying Living Expenses: Not hard at all  Food Insecurity: No Food Insecurity (11/20/2021)   Hunger Vital Sign    Worried About Running Out of Food in the Last Year: Never true    Ran Out of Food in the Last Year: Never true  Transportation Needs: No Transportation Needs (11/20/2021)   PRAPARE - Hydrologist (Medical): No    Lack of Transportation (Non-Medical): No  Physical Activity: Insufficiently Active (11/20/2021)   Exercise Vital Sign    Days of Exercise per Week: 2 days    Minutes of Exercise per Session: 30 min  Stress: No Stress Concern Present (11/20/2021)   [place]    Feeling of Stress : Not at all  Social Connections: Moderately Isolated (11/20/2021)   Social Connection and Isolation Panel [NHANES]    Frequency of Communication with Friends and Family: More than three times a week    Frequency of Social Gatherings with Friends and Family: More than three times a week    Attends Religious Services: Never    Marine scientist or Organizations: No    Attends Music therapist: Never    Marital Status: Married    Tobacco Counseling Counseling given: Not Answered Tobacco comments: cannot remember when she quit smoking   Clinical Intake:  Pre-visit preparation completed: Yes  Pain : No/denies pain     Nutritional Risks: None Diabetes: No  How often do you need to have someone help you when you read instructions, pamphlets, or other written materials from your doctor or pharmacy?: 1 - Never  Diabetic?no   Interpreter Needed?: No  Information entered by :: Jadene Pierini, LPN   Activities of Daily Living    11/20/2021   10:00 AM  In  your present state of health, do you have any difficulty performing the following activities:  Hearing? 0  Vision? 0  Difficulty concentrating or making decisions? 0  Walking or climbing stairs? 0  Dressing or bathing? 0  Doing errands, shopping? 0  Preparing Food and eating ? N  Using the Toilet? N  In the past six months, have you accidently leaked urine? N  Do you have problems with loss of bowel control? N  Managing your Medications? N  Managing your Finances? N  Housekeeping or managing your Housekeeping? N    Patient Care Team: Biagio Borg, MD as PCP - General (Internal Medicine) Leta Baptist, Earlean Polka, MD as Consulting Physician (Neurology)  Indicate any  recent Medical Services you may have received from other than [facility] providers in the past year (date may be approximate).     Assessment:   This is a routine wellness examination for Dashonna.  Hearing/Vision screen Vision Screening - Comments:: Annual eye exams wear glasses   Dietary issues and exercise activities discussed: Current Exercise Habits: Home exercise routine, Type of exercise: walking, Time (Minutes): 30, Frequency (Times/Week): 2, Weekly Exercise (Minutes/Week): 60, Intensity: Mild, Exercise limited by: None identified   Goals Addressed             This Visit's Progress    Exercise 3x per week (30 min per time)         Depression Screen    11/20/2021    9:58 AM 06/11/2021   11:28 AM 06/11/2021   11:10 AM 06/11/2020   11:24 AM 12/03/2019    1:19 PM 11/29/2017    3:35 PM 11/09/2016    1:07 PM  PHQ 2/9 Scores  PHQ - 2 Score 0 1 0 0 0 0 0  PHQ- 9 Score   2        Fall Risk    11/20/2021    9:55 AM 06/11/2021   11:28 AM 06/11/2021   11:10 AM 06/11/2020   11:24 AM 12/03/2019    1:18 PM  Fall Risk   Falls in the past year? 0 0 1 0 0  Number falls in past yr: 0 0 0 0 0  Injury with Fall? 0 0 0 0 1  Risk for fall due to : No Fall Risks   No Fall Risks No Fall Risks  Follow up Falls prevention  discussed    Falls evaluation completed    FALL RISK PREVENTION PERTAINING TO THE HOME:  Any stairs in or around the home? No  If so, are there any without handrails? No  Home free of loose throw rugs in walkways, pet beds, electrical cords, etc? Yes  Adequate lighting in your home to reduce risk of falls? Yes   ASSISTIVE DEVICES UTILIZED TO PREVENT FALLS:  Life alert? No  Use of a cane, walker or w/c? No  Grab bars in the bathroom? No  Shower chair or bench in shower? No  Elevated toilet seat or a handicapped toilet? No        11/20/2021   10:00 AM  6CIT Screen  What Year? 0 points  What month? 0 points  What time? 0 points  Count back from 20 0 points  Months in reverse 0 points  Repeat phrase 0 points  Total Score 0 points    Immunizations Immunization History  Administered Date(s) Administered   Fluad Quad(high Dose 65+) 12/01/2018, 12/03/2019   Influenza, High Dose Seasonal PF 11/29/2017   PFIZER(Purple Top)SARS-COV-2 Vaccination 04/12/2019, 05/08/2019, 12/27/2019   Pneumococcal Conjugate-13 12/03/2019   Pneumococcal Polysaccharide-23 12/01/2018   Tdap 12/01/2018    TDAP status: Up to date  Flu Vaccine status: Up to date  Pneumococcal vaccine status: Up to date  Covid-19 vaccine status: Completed vaccines  Qualifies for Shingles Vaccine? Yes   Zostavax completed No   Shingrix Completed?: No.    Education has been provided regarding the importance of this vaccine. Patient has been advised to call insurance company to determine out of pocket expense if they have not yet received this vaccine. Advised may also receive vaccine at local pharmacy or Health Dept. Verbalized acceptance and understanding.  Screening Tests Health Maintenance  Topic Date Due   Zoster Vaccines-  Shingrix (1 of 2) Never done   COLONOSCOPY (Pts 45-21yr Insurance coverage will need to be confirmed)  02/24/2021   COVID-19 Vaccine (5 - Pfizer series) 04/07/2021   INFLUENZA VACCINE   09/15/2021   MAMMOGRAM  01/26/2022   TETANUS/TDAP  11/30/2028   Pneumonia Vaccine 69 Years old  Completed   DEXA SCAN  Completed   Hepatitis C Screening  Completed   HPV VACCINES  Aged Out    Health Maintenance  Health Maintenance Due  Topic Date Due   Zoster Vaccines- Shingrix (1 of 2) Never done   COLONOSCOPY (Pts 45-414yrInsurance coverage will need to be confirmed)  02/24/2021   COVID-19 Vaccine (5 - Pfizer series) 04/07/2021   INFLUENZA VACCINE  09/15/2021    Colorectal cancer screening: Referral to GI placed 11/20/2021. Pt aware the office will call re: appt.  Mammogram status: Completed 01/26/2021. Repeat every year  Bone Density status: Completed 12/05/2018. Results reflect: Bone density results: OSTEOPENIA. Repeat every 5 years.  Lung Cancer Screening: (Low Dose CT Chest recommended if Age 69-80ears, 30 pack-year currently smoking OR have quit w/in 15years.) does not qualify.   Lung Cancer Screening Referral: n/a  Additional Screening:  Hepatitis C Screening: does not qualify; Completed 11/29/2017  Vision Screening: Recommended annual ophthalmology exams for early detection of glaucoma and other disorders of the eye. Is the patient up to date with their annual eye exam?  Yes  Who is the provider or what is the name of the office in which the patient attends annual eye exams? [facility]  If pt is not established with a provider, would they like to be referred to a provider to establish care? No .   Dental Screening: Recommended annual dental exams for proper oral hygiene  Community Resource Referral / Chronic Care Management: CRR required this visit?  No   CCM required this visit?  No      Plan:     I have personally reviewed and noted the following in the patient's chart:   Medical and social history Use of alcohol, tobacco or illicit drugs  Current medications and supplements including opioid prescriptions. Patient is not currently taking opioid  prescriptions. Functional ability and status Nutritional status Physical activity Advanced directives List of other physicians Hospitalizations, surgeries, and ER visits in previous 12 months Vitals Screenings to include cognitive, depression, and falls Referrals and appointments  In addition, I have reviewed and discussed with patient certain preventive protocols, quality metrics, and best practice recommendations. A written personalized care plan for preventive services as well as general preventive health recommendations were provided to patient.     LaDaphane ShepherdLPN   [id number] Nurse Notes: Due Flu Vaccine

## 2021-11-20 NOTE — Patient Instructions (Signed)
Jennifer Hobbs , Thank you for taking time to come for your Medicare Wellness Visit. I appreciate your ongoing commitment to your health goals. Please review the following plan we discussed and let me know if I can assist you in the future.   These are the goals we discussed:  Goals      Exercise 3x per week (30 min per time)        This is a list of the screening recommended for you and due dates:  Health Maintenance  Topic Date Due   Zoster (Shingles) Vaccine (1 of 2) Never done   Colon Cancer Screening  02/24/2021   COVID-19 Vaccine (5 - Pfizer series) 04/07/2021   Flu Shot  09/15/2021   Mammogram  01/26/2022   Tetanus Vaccine  11/30/2028   Pneumonia Vaccine  Completed   DEXA scan (bone density measurement)  Completed   Hepatitis C Screening: USPSTF Recommendation to screen - Ages 67-79 yo.  Completed   HPV Vaccine  Aged Out    Advanced directives: Advance directive discussed with you today. I have provided a copy for you to complete at home and have notarized. Once this is complete please bring a copy in to our office so we can scan it into your chart.   Conditions/risks identified: Aim for 30 minutes of exercise or brisk walking, 6-8 glasses of water, and 5 servings of fruits and vegetables each day.   Next appointment: Follow up in one year for your annual wellness visit    Preventive Care 65 Years and Older, Female Preventive care refers to lifestyle choices and visits with your health care provider that can promote health and wellness. What does preventive care include? A yearly physical exam. This is also called an annual well check. Dental exams once or twice a year. Routine eye exams. Ask your health care provider how often you should have your eyes checked. Personal lifestyle choices, including: Daily care of your teeth and gums. Regular physical activity. Eating a healthy diet. Avoiding tobacco and drug use. Limiting alcohol use. Practicing safe sex. Taking  low-dose aspirin every day. Taking vitamin and mineral supplements as recommended by your health care provider. What happens during an annual well check? The services and screenings done by your health care provider during your annual well check will depend on your age, overall health, lifestyle risk factors, and family history of disease. Counseling  Your health care provider may ask you questions about your: Alcohol use. Tobacco use. Drug use. Emotional well-being. Home and relationship well-being. Sexual activity. Eating habits. History of falls. Memory and ability to understand (cognition). Work and work Statistician. Reproductive health. Screening  You may have the following tests or measurements: Height, weight, and BMI. Blood pressure. Lipid and cholesterol levels. These may be checked every 5 years, or more frequently if you are over 48 years old. Skin check. Lung cancer screening. You may have this screening every year starting at age 74 if you have a 30-pack-year history of smoking and currently smoke or have quit within the past 15 years. Fecal occult blood test (FOBT) of the stool. You may have this test every year starting at age 107. Flexible sigmoidoscopy or colonoscopy. You may have a sigmoidoscopy every 5 years or a colonoscopy every 10 years starting at age 70. Hepatitis C blood test. Hepatitis B blood test. Sexually transmitted disease (STD) testing. Diabetes screening. This is done by checking your blood sugar (glucose) after you have not eaten for a while (fasting).  You may have this done every 1-3 years. Bone density scan. This is done to screen for osteoporosis. You may have this done starting at age 31. Mammogram. This may be done every 1-2 years. Talk to your health care provider about how often you should have regular mammograms. Talk with your health care provider about your test results, treatment options, and if necessary, the need for more tests. Vaccines   Your health care provider may recommend certain vaccines, such as: Influenza vaccine. This is recommended every year. Tetanus, diphtheria, and acellular pertussis (Tdap, Td) vaccine. You may need a Td booster every 10 years. Zoster vaccine. You may need this after age 72. Pneumococcal 13-valent conjugate (PCV13) vaccine. One dose is recommended after age 31. Pneumococcal polysaccharide (PPSV23) vaccine. One dose is recommended after age 70. Talk to your health care provider about which screenings and vaccines you need and how often you need them. This information is not intended to replace advice given to you by your health care provider. Make sure you discuss any questions you have with your health care provider. Document Released: 02/28/2015 Document Revised: 10/22/2015 Document Reviewed: 12/03/2014 Elsevier Interactive Patient Education  2017 [place] Prevention in the Home Falls can cause injuries. They can happen to people of all ages. There are many things you can do to make your home safe and to help prevent falls. What can I do on the outside of my home? Regularly fix the edges of walkways and driveways and fix any cracks. Remove anything that might make you trip as you walk through a door, such as a raised step or threshold. Trim any bushes or trees on the path to your home. Use bright outdoor lighting. Clear any walking paths of anything that might make someone trip, such as rocks or tools. Regularly check to see if handrails are loose or broken. Make sure that both sides of any steps have handrails. Any raised decks and porches should have guardrails on the edges. Have any leaves, snow, or ice cleared regularly. Use sand or salt on walking paths during winter. Clean up any spills in your garage right away. This includes oil or grease spills. What can I do in the bathroom? Use night lights. Install grab bars by the toilet and in the tub and shower. Do not use towel  bars as grab bars. Use non-skid mats or decals in the tub or shower. If you need to sit down in the shower, use a plastic, non-slip stool. Keep the floor dry. Clean up any water that spills on the floor as soon as it happens. Remove soap buildup in the tub or shower regularly. Attach bath mats securely with double-sided non-slip rug tape. Do not have throw rugs and other things on the floor that can make you trip. What can I do in the bedroom? Use night lights. Make sure that you have a light by your bed that is easy to reach. Do not use any sheets or blankets that are too big for your bed. They should not hang down onto the floor. Have a firm chair that has side arms. You can use this for support while you get dressed. Do not have throw rugs and other things on the floor that can make you trip. What can I do in the kitchen? Clean up any spills right away. Avoid walking on wet floors. Keep items that you use a lot in easy-to-reach places. If you need to reach something above you, use a  strong step stool that has a grab bar. Keep electrical cords out of the way. Do not use floor polish or wax that makes floors slippery. If you must use wax, use non-skid floor wax. Do not have throw rugs and other things on the floor that can make you trip. What can I do with my stairs? Do not leave any items on the stairs. Make sure that there are handrails on both sides of the stairs and use them. Fix handrails that are broken or loose. Make sure that handrails are as long as the stairways. Check any carpeting to make sure that it is firmly attached to the stairs. Fix any carpet that is loose or worn. Avoid having throw rugs at the top or bottom of the stairs. If you do have throw rugs, attach them to the floor with carpet tape. Make sure that you have a light switch at the top of the stairs and the bottom of the stairs. If you do not have them, ask someone to add them for you. What else can I do to help  prevent falls? Wear shoes that: Do not have high heels. Have rubber bottoms. Are comfortable and fit you well. Are closed at the toe. Do not wear sandals. If you use a stepladder: Make sure that it is fully opened. Do not climb a closed stepladder. Make sure that both sides of the stepladder are locked into place. Ask someone to hold it for you, if possible. Clearly mark and make sure that you can see: Any grab bars or handrails. First and last steps. Where the edge of each step is. Use tools that help you move around (mobility aids) if they are needed. These include: Canes. Walkers. Scooters. Crutches. Turn on the lights when you go into a dark area. Replace any light bulbs as soon as they burn out. Set up your furniture so you have a clear path. Avoid moving your furniture around. If any of your floors are uneven, fix them. If there are any pets around you, be aware of where they are. Review your medicines with your doctor. Some medicines can make you feel dizzy. This can increase your chance of falling. Ask your doctor what other things that you can do to help prevent falls. This information is not intended to replace advice given to you by your health care provider. Make sure you discuss any questions you have with your health care provider. Document Released: 11/28/2008 Document Revised: 07/10/2015 Document Reviewed: 03/08/2014 Elsevier Interactive Patient Education  2017 [employer].

## 2021-12-10 ENCOUNTER — Ambulatory Visit: Payer: PPO | Admitting: Internal Medicine

## 2021-12-22 ENCOUNTER — Ambulatory Visit (INDEPENDENT_AMBULATORY_CARE_PROVIDER_SITE_OTHER): Payer: PPO | Admitting: Internal Medicine

## 2021-12-22 ENCOUNTER — Encounter: Payer: Self-pay | Admitting: Internal Medicine

## 2021-12-22 VITALS — BP 134/80 | HR 92 | Temp 98.2°F | Ht 67.0 in | Wt 180.0 lb

## 2021-12-22 DIAGNOSIS — M5441 Lumbago with sciatica, right side: Secondary | ICD-10-CM

## 2021-12-22 DIAGNOSIS — Z23 Encounter for immunization: Secondary | ICD-10-CM

## 2021-12-22 DIAGNOSIS — I10 Essential (primary) hypertension: Secondary | ICD-10-CM | POA: Diagnosis not present

## 2021-12-22 DIAGNOSIS — E785 Hyperlipidemia, unspecified: Secondary | ICD-10-CM | POA: Diagnosis not present

## 2021-12-22 DIAGNOSIS — E559 Vitamin D deficiency, unspecified: Secondary | ICD-10-CM | POA: Diagnosis not present

## 2021-12-22 DIAGNOSIS — E538 Deficiency of other specified B group vitamins: Secondary | ICD-10-CM

## 2021-12-22 DIAGNOSIS — R739 Hyperglycemia, unspecified: Secondary | ICD-10-CM | POA: Diagnosis not present

## 2021-12-22 DIAGNOSIS — G8929 Other chronic pain: Secondary | ICD-10-CM | POA: Diagnosis not present

## 2021-12-22 DIAGNOSIS — M5442 Lumbago with sciatica, left side: Secondary | ICD-10-CM | POA: Diagnosis not present

## 2021-12-22 LAB — LIPID PANEL
Cholesterol: 209 mg/dL — ABNORMAL HIGH (ref 0–200)
HDL: 87.6 mg/dL (ref >39.00)
LDL Cholesterol: 99 mg/dL (ref 0–99)
NonHDL: 121.88
Total CHOL/HDL Ratio: 2
Triglycerides: 116 mg/dL (ref 0.0–149.0)
VLDL: 23.2 mg/dL (ref 0.0–40.0)

## 2021-12-22 LAB — HEPATIC FUNCTION PANEL
ALT: 16 U/L (ref 0–35)
AST: 24 U/L (ref 0–37)
Albumin: 4.4 g/dL (ref 3.5–5.2)
Alkaline Phosphatase: 83 U/L (ref 39–117)
Bilirubin, Direct: 0.1 mg/dL (ref 0.0–0.3)
Total Bilirubin: 0.5 mg/dL (ref 0.2–1.2)
Total Protein: 7.3 g/dL (ref 6.0–8.3)

## 2021-12-22 LAB — BASIC METABOLIC PANEL
BUN: 25 mg/dL — ABNORMAL HIGH (ref 6–23)
CO2: 27 mEq/L (ref 19–32)
Calcium: 9.8 mg/dL (ref 8.4–10.5)
Chloride: 103 mEq/L (ref 96–112)
Creatinine, Ser: 1.19 mg/dL (ref 0.40–1.20)
GFR: 46.8 mL/min — ABNORMAL LOW (ref >60.00)
Glucose, Bld: 105 mg/dL — ABNORMAL HIGH (ref 70–99)
Potassium: 3.5 mEq/L (ref 3.5–5.1)
Sodium: 140 mEq/L (ref 135–145)

## 2021-12-22 LAB — VITAMIN D 25 HYDROXY (VIT D DEFICIENCY, FRACTURES): VITD: 43.35 ng/mL (ref 30.00–100.00)

## 2021-12-22 LAB — VITAMIN B12: Vitamin B-12: 467 pg/mL (ref 211–911)

## 2021-12-22 LAB — HEMOGLOBIN A1C: Hgb A1c MFr Bld: 6.3 % (ref 4.6–6.5)

## 2021-12-22 NOTE — Assessment & Plan Note (Signed)
BP Readings from Last 3 Encounters:  12/22/21 134/80  06/11/21 140/72  06/11/20 (!) 160/92   Stable, pt to continue medical treatment benicar 40 mg qd, dyazide 1  qd

## 2021-12-22 NOTE — Assessment & Plan Note (Signed)
Lab Results  Component Value Date   HGBA1C 6.0 06/11/2021   Stable, pt to continue current medical treatment - diet, wt control, excercise

## 2021-12-22 NOTE — Patient Instructions (Addendum)
You had the flu shot today  You will be contacted regarding the referral for: MRI for the lower back  Please continue all other medications as before, and refills have been done if requested.  Please have the pharmacy call with any other refills you may need.  Please continue your efforts at being more active, low cholesterol diet, and weight control.  Please keep your appointments with your specialists as you may have planned  Please go to the LAB at the blood drawing area for the tests to be done  You will be contacted by phone if any changes need to be made immediately.  Otherwise, you will receive a letter about your results with an explanation, but please check with MyChart first.  Please remember to sign up for MyChart if you have not done so, as this will be important to you in the future with finding out test results, communicating by private email, and scheduling acute appointments online when needed.  Please make an Appointment to return in 6 months, or sooner if needed

## 2021-12-22 NOTE — Progress Notes (Signed)
Patient ID: Jennifer Hobbs, female   DOB: [birth year], 71 y.o.   MRN: [id number]        Chief Complaint: follow up HTN, HLD and hyperglycemia , low back pain with bilkateral leg numbness pain and fall x 2, low vit d       HPI:  Jennifer Hobbs is a 71 y.o. female here verall doing ok, Pt denies chest pain, increased sob or doe, wheezing, orthopnea, PND, increased LE swelling, palpitations, dizziness or syncope.   Pt denies polydipsia, polyuria, or new focal neuro s/s.    Pt denies fever, wt loss, night sweats, loss of appetite, or other constitutional symptoms  Does not feel ill today, but has worsening 2 mo bilateral lower back pain with tingling to the leg now constant and intermittent weakness with fallx x 2.  Has known Ls spine DDD by plain film 2017, and has done ok since then. Due for flu shot.  Also has noticed mild memory changes recently with less ability to recall but seems minor to her for now.        Wt Readings from Last 3 Encounters:  12/22/21 180 lb (81.6 kg)  11/20/21 178 lb (80.7 kg)  06/11/21 179 lb (81.2 kg)   BP Readings from Last 3 Encounters:  12/22/21 134/80  06/11/21 140/72  06/11/20 (!) 160/92         Past Medical History:  Diagnosis Date   Allergy    DDD (degenerative disc disease), lumbar    Degenerative disc disease, lumbar 07/10/2015   Past Surgical History:  Procedure Laterality Date   UTERINE FIBROID SURGERY      reports that she quit smoking about 12 years ago. Her smoking use included cigarettes. She has never used smokeless tobacco. She reports current alcohol use. She reports that she does not use drugs. family history includes Diabetes in her sister; Multiple sclerosis (age of onset: 10) in her daughter; Stroke in her brother. No Known Allergies Current Outpatient Medications on File Prior to Visit  Medication Sig Dispense Refill   aspirin EC 81 MG tablet Take 1 tablet (81 mg total) by mouth daily.     cyclobenzaprine (FLEXERIL) 5 MG tablet 1 tab  by mouth three times per day as needed 30 tablet 3   meloxicam (MOBIC) 15 MG tablet Take 1 tablet (15 mg total) by mouth daily. 90 tablet 3   Multiple Vitamin (MULTIVITAMIN) tablet Take 1 tablet by mouth daily.     olmesartan (BENICAR) 40 MG tablet Take 1 tablet (40 mg total) by mouth daily. 90 tablet 3   rosuvastatin (CRESTOR) 20 MG tablet Take 1 tablet (20 mg total) by mouth daily. 90 tablet 3   triamterene-hydrochlorothiazide (DYAZIDE) 37.5-25 MG capsule TAKE 1 CAPSULE BY MOUTH EVERY DAY 90 capsule 3   No current facility-administered medications on file prior to visit.        ROS:  All others reviewed and negative.  Objective        PE:  BP 134/80 (BP Location: Right Arm, Patient Position: Sitting, Cuff Size: Large)   Pulse 92   Temp 98.2 F (36.8 C) (Oral)   Ht '5\' 7"'$  (1.702 m)   Wt 180 lb (81.6 kg)   SpO2 97%   BMI 28.19 kg/m                 Constitutional: Pt appears in NAD               HENT: Head: NCAT.  Right Ear: External ear normal.                 Left Ear: External ear normal.                Eyes: . Pupils are equal, round, and reactive to light. Conjunctivae and EOM are normal               Nose: without d/c or deformity               Neck: Neck supple. Gross normal ROM               Cardiovascular: Normal rate and regular rhythm.                 Pulmonary/Chest: Effort normal and breath sounds without rales or wheezing.                Abd:  Soft, NT, ND, + BS, no organomegaly               Neurological: Pt is alert. At baseline orientation, motor with 4+/5 bilateral leg wekaness               Skin: Skin is warm. No rashes, no other new lesions, LE edema - none               Psychiatric: Pt behavior is normal without agitation   Micro: none  Cardiac tracings I have personally interpreted today:  none  Pertinent Radiological findings (summarize): none   Lab Results  Component Value Date   WBC 8.0 06/11/2021   HGB 11.8 (L) 06/11/2021   HCT 37.5  06/11/2021   PLT 277.0 06/11/2021   GLUCOSE 97 06/11/2021   CHOL 229 (H) 06/11/2021   TRIG 117.0 06/11/2021   HDL 90.80 06/11/2021   LDLDIRECT 139.0 11/29/2017   LDLCALC 114 (H) 06/11/2021   ALT 18 06/11/2021   AST 23 06/11/2021   NA 139 06/11/2021   K 3.8 06/11/2021   CL 102 06/11/2021   CREATININE 1.36 (H) 06/11/2021   BUN 28 (H) 06/11/2021   CO2 29 06/11/2021   TSH 1.42 06/11/2021   HGBA1C 6.0 06/11/2021   Assessment/Plan:  Jennifer Hobbs is a 71 y.o. Black or African American [2] female with  has a past medical history of Allergy, DDD (degenerative disc disease), lumbar, and Degenerative disc disease, lumbar (07/10/2015).  Vitamin D deficiency Last vitamin D Lab Results  Component Value Date   VD25OH 37.05 06/11/2021   Better but still low, for increased Vit D 2000 u qd, oral replacement   Hypertension, uncontrolled BP Readings from Last 3 Encounters:  12/22/21 134/80  06/11/21 140/72  06/11/20 (!) 160/92   Stable, pt to continue medical treatment benicar 40 mg qd, dyazide 1  qd   Hyperlipidemia Lab Results  Component Value Date   LDLCALC 114 (H) 06/11/2021   Uncontrolled now on new statin crestor 20 mg, pt to continue current statin and f/u lab today    Hyperglycemia Lab Results  Component Value Date   HGBA1C 6.0 06/11/2021   Stable, pt to continue current medical treatment - diet, wt control, excercise   Low back pain Bilateral lumbar with Bilateral leg numbness and intermittent weakness  with fall x 2 - for MRI LS Spine  Followup: Return in about 6 months (around 06/22/2022).  Cathlean Cower, MD 12/22/2021 10:49 AM [facility]

## 2021-12-22 NOTE — Assessment & Plan Note (Signed)
Last vitamin D Lab Results  Component Value Date   VD25OH 37.05 06/11/2021   Better but still low, for increased Vit D 2000 u qd, oral replacement

## 2021-12-22 NOTE — Assessment & Plan Note (Signed)
Bilateral lumbar with Bilateral leg numbness and intermittent weakness  with fall x 2 - for MRI LS Spine

## 2021-12-22 NOTE — Assessment & Plan Note (Signed)
Lab Results  Component Value Date   LDLCALC 114 (H) 06/11/2021   Uncontrolled now on new statin crestor 20 mg, pt to continue current statin and f/u lab today

## 2022-01-09 ENCOUNTER — Ambulatory Visit
Admission: RE | Admit: 2022-01-09 | Discharge: 2022-01-09 | Disposition: A | Discharge status: Home / Self Care | Payer: PPO | Source: Ambulatory Visit | Attending: Internal Medicine | Admitting: Internal Medicine

## 2022-01-09 DIAGNOSIS — M545 Low back pain, unspecified: Secondary | ICD-10-CM | POA: Diagnosis not present

## 2022-01-09 DIAGNOSIS — G8929 Other chronic pain: Secondary | ICD-10-CM

## 2022-01-09 DIAGNOSIS — R200 Anesthesia of skin: Secondary | ICD-10-CM | POA: Diagnosis not present

## 2022-01-09 DIAGNOSIS — M48061 Spinal stenosis, lumbar region without neurogenic claudication: Secondary | ICD-10-CM | POA: Diagnosis not present

## 2022-01-11 ENCOUNTER — Encounter: Payer: Self-pay | Admitting: Internal Medicine

## 2022-01-19 ENCOUNTER — Ambulatory Visit (INDEPENDENT_AMBULATORY_CARE_PROVIDER_SITE_OTHER): Payer: PPO | Admitting: Internal Medicine

## 2022-01-19 VITALS — BP 162/80 | HR 113 | Temp 97.7°F | Ht 67.0 in | Wt 182.0 lb

## 2022-01-19 DIAGNOSIS — R739 Hyperglycemia, unspecified: Secondary | ICD-10-CM

## 2022-01-19 DIAGNOSIS — E559 Vitamin D deficiency, unspecified: Secondary | ICD-10-CM

## 2022-01-19 DIAGNOSIS — R296 Repeated falls: Secondary | ICD-10-CM | POA: Diagnosis not present

## 2022-01-19 DIAGNOSIS — I10 Essential (primary) hypertension: Secondary | ICD-10-CM

## 2022-01-19 DIAGNOSIS — M5416 Radiculopathy, lumbar region: Secondary | ICD-10-CM

## 2022-01-19 MED ORDER — GABAPENTIN 100 MG PO CAPS: 100.0000 mg | ORAL_CAPSULE | Freq: Three times a day (TID) | ORAL | 5 refills | Status: DC

## 2022-01-19 MED ORDER — CYCLOBENZAPRINE HCL 5 MG PO TABS: ORAL_TABLET | ORAL | 3 refills | Status: DC

## 2022-01-19 NOTE — Progress Notes (Addendum)
Patient ID: Jennifer Hobbs, female   DOB: 06-Jan-1951, 71 y.o.   MRN: [id number]        Chief Complaint: follow up recent falls x 3 with worsening left low back pain and sciatic pain       HPI:  Jennifer Hobbs is a 71 y.o. female here with c/o 2 week onset fals x 3 without injury but associated with severe low back pain with pain radiation to the LLE with weakness.  Here with cane today.  Has known significaint lumbar djd DDD   Mobic helps only somewhat.  Pain now 7/10.  Pt denies chest pain, increased sob or doe, wheezing, orthopnea, PND, increased LE swelling, palpitations, dizziness or syncope.   Pt denies polydipsia, polyuria, or new focal neuro s/s.    Pt denies fever, wt loss, night sweats, loss of appetite, or other constitutional symptoms  BP at home has been < 140/90.  Due for shingrix  Wt Readings from Last 3 Encounters:  01/19/22 182 lb (82.6 kg)  12/22/21 180 lb (81.6 kg)  11/20/21 178 lb (80.7 kg)   BP Readings from Last 3 Encounters:  01/19/22 (!) 162/80  12/22/21 134/80  06/11/21 140/72         Past Medical History:  Diagnosis Date   Allergy    DDD (degenerative disc disease), lumbar    Degenerative disc disease, lumbar 07/10/2015   Past Surgical History:  Procedure Laterality Date   UTERINE FIBROID SURGERY      reports that she quit smoking about 12 years ago. Her smoking use included cigarettes. She has never used smokeless tobacco. She reports current alcohol use. She reports that she does not use drugs. family history includes Diabetes in her sister; Multiple sclerosis (age of onset: 35) in her daughter; Stroke in her brother. No Known Allergies Current Outpatient Medications on File Prior to Visit  Medication Sig Dispense Refill   aspirin EC 81 MG tablet Take 1 tablet (81 mg total) by mouth daily.     meloxicam (MOBIC) 15 MG tablet Take 1 tablet (15 mg total) by mouth daily. 90 tablet 3   Multiple Vitamin (MULTIVITAMIN) tablet Take 1 tablet by mouth daily.      olmesartan (BENICAR) 40 MG tablet Take 1 tablet (40 mg total) by mouth daily. 90 tablet 3   rosuvastatin (CRESTOR) 20 MG tablet Take 1 tablet (20 mg total) by mouth daily. 90 tablet 3   triamterene-hydrochlorothiazide (DYAZIDE) 37.5-25 MG capsule TAKE 1 CAPSULE BY MOUTH EVERY DAY 90 capsule 3   No current facility-administered medications on file prior to visit.        ROS:  All others reviewed and negative.  Objective        PE:  BP (!) 162/80 (BP Location: Right Arm, Patient Position: Sitting, Cuff Size: Large)   Pulse (!) 113   Temp 97.7 F (36.5 C) (Oral)   Ht '5\' 7"'$  (1.702 m)   Wt 182 lb (82.6 kg)   SpO2 95%   BMI 28.51 kg/m                 Constitutional: Pt appears in NAD               HENT: Head: NCAT.                Right Ear: External ear normal.                 Left Ear: External ear normal.  Eyes: . Pupils are equal, round, and reactive to light. Conjunctivae and EOM are normal               Nose: without d/c or deformity               Neck: Neck supple. Gross normal ROM               Cardiovascular: Normal rate and regular rhythm.                 Pulmonary/Chest: Effort normal and breath sounds without rales or wheezing.                Abd:  Soft, NT, ND, + BS, no organomegaly               Neurological: Pt is alert. At baseline orientation, motor with 4/5 LLE motor weakness               Skin: Skin is warm. No rashes, no other new lesions, LE edema - none               Psychiatric: Pt behavior is normal without agitation   Micro: none  Cardiac tracings I have personally interpreted today:  none  Pertinent Radiological findings (summarize): none   Lab Results  Component Value Date   WBC 8.0 06/11/2021   HGB 11.8 (L) 06/11/2021   HCT 37.5 06/11/2021   PLT 277.0 06/11/2021   GLUCOSE 105 (H) 12/22/2021   CHOL 209 (H) 12/22/2021   TRIG 116.0 12/22/2021   HDL 87.60 12/22/2021   LDLDIRECT 139.0 11/29/2017   LDLCALC 99 12/22/2021   ALT 16  12/22/2021   AST 24 12/22/2021   NA 140 12/22/2021   K 3.5 12/22/2021   CL 103 12/22/2021   CREATININE 1.19 12/22/2021   BUN 25 (H) 12/22/2021   CO2 27 12/22/2021   TSH 1.42 06/11/2021   HGBA1C 6.3 12/22/2021   Assessment/Plan:  Jennifer Hobbs is a 71 y.o. Black or African American [2] female with  has a past medical history of Allergy, DDD (degenerative disc disease), lumbar, and Degenerative disc disease, lumbar (07/10/2015).  Hyperglycemia Lab Results  Component Value Date   HGBA1C 6.3 12/22/2021   Stable, pt to continue current medical treatment  - diet, wt control, excercise   Hypertension, uncontrolled BP Readings from Last 3 Encounters:  01/19/22 (!) 162/80  12/22/21 134/80  06/11/21 140/72   Uncontrolled, likely due to pain, pt states controlled at home, pt to continue medical treatment benicar 40 mg qd, dyazide 1qd    Vitamin D deficiency Last vitamin D Lab Results  Component Value Date   VD25OH 43.35 12/22/2021   Stable, cont oral replacement   Left lumbar radiculopathy New onset, either related or cause of falls x 3 without specific injury, now with pain and neuro change, pt to continue mobic qd prn, restart flexeril 5 tid prn, start gabapentin 100 mg tid, and refer Neurosurgury  Recurrent falls No other specific injury apparently, consider PT if not improving, cont cane use  Followup: Return in about 5 months (around 06/23/2022).  Cathlean Cower, MD 01/22/2022 9:11 PM [facility]

## 2022-01-19 NOTE — Patient Instructions (Addendum)
Please have your Shingrix (shingles) shots done at your local pharmacy.  Please take all new medication as prescribed -the gabapentin 100 mg three times per day  Ok to restart the muscle relaxer as needed  Please continue all other medications as before, including the mobic  Please have the pharmacy call with any other refills you may need.  Please continue your efforts at being more active, low cholesterol diet, and weight control.  Please keep your appointments with your specialists as you may have planned  You will be contacted regarding the referral for: Neurosurgury  Please make an Appointment to return in Jun 23 2022, or sooner if needed

## 2022-01-22 ENCOUNTER — Encounter: Payer: Self-pay | Admitting: Internal Medicine

## 2022-01-22 DIAGNOSIS — R296 Repeated falls: Secondary | ICD-10-CM | POA: Insufficient documentation

## 2022-01-22 DIAGNOSIS — M5416 Radiculopathy, lumbar region: Secondary | ICD-10-CM | POA: Insufficient documentation

## 2022-01-22 NOTE — Assessment & Plan Note (Signed)
BP Readings from Last 3 Encounters:  01/19/22 (!) 162/80  12/22/21 134/80  06/11/21 140/72   Uncontrolled, likely due to pain, pt states controlled at home, pt to continue medical treatment benicar 40 mg qd, dyazide 1qd

## 2022-01-22 NOTE — Assessment & Plan Note (Signed)
New onset, either related or cause of falls x 3 without specific injury, now with pain and neuro change, pt to continue mobic qd prn, restart flexeril 5 tid prn, start gabapentin 100 mg tid, and refer Neurosurgury

## 2022-01-22 NOTE — Assessment & Plan Note (Signed)
Lab Results  Component Value Date   HGBA1C 6.3 12/22/2021   Stable, pt to continue current medical treatment  - diet, wt control, excercise

## 2022-01-22 NOTE — Assessment & Plan Note (Signed)
No other specific injury apparently, consider PT if not improving, cont cane use

## 2022-01-22 NOTE — Assessment & Plan Note (Signed)
Last vitamin D Lab Results  Component Value Date   VD25OH 43.35 12/22/2021   Stable, cont oral replacement

## 2022-02-01 DIAGNOSIS — Z1231 Encounter for screening mammogram for malignant neoplasm of breast: Secondary | ICD-10-CM | POA: Diagnosis not present

## 2022-02-01 LAB — HM MAMMOGRAPHY

## 2022-02-02 DIAGNOSIS — M5136 Other intervertebral disc degeneration, lumbar region: Secondary | ICD-10-CM | POA: Diagnosis not present

## 2022-02-02 DIAGNOSIS — M5137 Other intervertebral disc degeneration, lumbosacral region: Secondary | ICD-10-CM | POA: Diagnosis not present

## 2022-02-02 DIAGNOSIS — M5416 Radiculopathy, lumbar region: Secondary | ICD-10-CM | POA: Diagnosis not present

## 2022-02-02 DIAGNOSIS — M4726 Other spondylosis with radiculopathy, lumbar region: Secondary | ICD-10-CM | POA: Diagnosis not present

## 2022-03-29 DIAGNOSIS — H25811 Combined forms of age-related cataract, right eye: Secondary | ICD-10-CM | POA: Diagnosis not present

## 2022-04-15 DIAGNOSIS — H269 Unspecified cataract: Secondary | ICD-10-CM | POA: Diagnosis not present

## 2022-04-15 DIAGNOSIS — H25811 Combined forms of age-related cataract, right eye: Secondary | ICD-10-CM | POA: Diagnosis not present

## 2022-05-20 DIAGNOSIS — H269 Unspecified cataract: Secondary | ICD-10-CM | POA: Diagnosis not present

## 2022-05-20 DIAGNOSIS — H25812 Combined forms of age-related cataract, left eye: Secondary | ICD-10-CM | POA: Diagnosis not present

## 2022-06-02 ENCOUNTER — Other Ambulatory Visit: Payer: Self-pay | Admitting: Internal Medicine

## 2022-06-22 ENCOUNTER — Other Ambulatory Visit: Payer: Self-pay | Admitting: Internal Medicine

## 2022-06-22 DIAGNOSIS — I10 Essential (primary) hypertension: Secondary | ICD-10-CM

## 2022-06-23 ENCOUNTER — Encounter: Payer: Self-pay | Admitting: Internal Medicine

## 2022-06-23 ENCOUNTER — Ambulatory Visit (INDEPENDENT_AMBULATORY_CARE_PROVIDER_SITE_OTHER): Payer: PPO | Admitting: Internal Medicine

## 2022-06-23 ENCOUNTER — Other Ambulatory Visit: Payer: Self-pay | Admitting: Internal Medicine

## 2022-06-23 VITALS — BP 120/78 | HR 87 | Temp 97.9°F | Ht 67.0 in | Wt 185.0 lb

## 2022-06-23 DIAGNOSIS — Z0001 Encounter for general adult medical examination with abnormal findings: Secondary | ICD-10-CM | POA: Diagnosis not present

## 2022-06-23 DIAGNOSIS — I10 Essential (primary) hypertension: Secondary | ICD-10-CM | POA: Diagnosis not present

## 2022-06-23 DIAGNOSIS — M5442 Lumbago with sciatica, left side: Secondary | ICD-10-CM

## 2022-06-23 DIAGNOSIS — M5441 Lumbago with sciatica, right side: Secondary | ICD-10-CM

## 2022-06-23 DIAGNOSIS — R739 Hyperglycemia, unspecified: Secondary | ICD-10-CM | POA: Diagnosis not present

## 2022-06-23 DIAGNOSIS — E559 Vitamin D deficiency, unspecified: Secondary | ICD-10-CM

## 2022-06-23 DIAGNOSIS — Z860100 Personal history of colon polyps, unspecified: Secondary | ICD-10-CM

## 2022-06-23 DIAGNOSIS — E538 Deficiency of other specified B group vitamins: Secondary | ICD-10-CM

## 2022-06-23 DIAGNOSIS — E785 Hyperlipidemia, unspecified: Secondary | ICD-10-CM | POA: Diagnosis not present

## 2022-06-23 DIAGNOSIS — G8929 Other chronic pain: Secondary | ICD-10-CM | POA: Diagnosis not present

## 2022-06-23 DIAGNOSIS — R3129 Other microscopic hematuria: Secondary | ICD-10-CM

## 2022-06-23 LAB — HEPATIC FUNCTION PANEL
ALT: 16 U/L (ref 0–35)
AST: 24 U/L (ref 0–37)
Albumin: 4.4 g/dL (ref 3.5–5.2)
Alkaline Phosphatase: 78 U/L (ref 39–117)
Bilirubin, Direct: 0.1 mg/dL (ref 0.0–0.3)
Total Bilirubin: 0.6 mg/dL (ref 0.2–1.2)
Total Protein: 7.3 g/dL (ref 6.0–8.3)

## 2022-06-23 LAB — BASIC METABOLIC PANEL
BUN: 26 mg/dL — ABNORMAL HIGH (ref 6–23)
CO2: 26 mEq/L (ref 19–32)
Calcium: 10.1 mg/dL (ref 8.4–10.5)
Chloride: 104 mEq/L (ref 96–112)
Creatinine, Ser: 1.22 mg/dL — ABNORMAL HIGH (ref 0.40–1.20)
GFR: 45.26 mL/min — ABNORMAL LOW (ref >60.00)
Glucose, Bld: 97 mg/dL (ref 70–99)
Potassium: 3.9 mEq/L (ref 3.5–5.1)
Sodium: 141 mEq/L (ref 135–145)

## 2022-06-23 LAB — CBC WITH DIFFERENTIAL/PLATELET
Basophils Absolute: 0 10*3/uL (ref 0.0–0.1)
Basophils Relative: 0.5 % (ref 0.0–3.0)
Eosinophils Absolute: 0.1 10*3/uL (ref 0.0–0.7)
Eosinophils Relative: 1.7 % (ref 0.0–5.0)
HCT: 38.3 % (ref 36.0–46.0)
Hemoglobin: 12.4 g/dL (ref 12.0–15.0)
Lymphocytes Relative: 31.5 % (ref 12.0–46.0)
Lymphs Abs: 2.3 10*3/uL (ref 0.7–4.0)
MCHC: 32.3 g/dL (ref 30.0–36.0)
MCV: 78.7 fl (ref 78.0–100.0)
Monocytes Absolute: 0.6 10*3/uL (ref 0.1–1.0)
Monocytes Relative: 7.5 % (ref 3.0–12.0)
Neutro Abs: 4.4 10*3/uL (ref 1.4–7.7)
Neutrophils Relative %: 58.8 % (ref 43.0–77.0)
Platelets: 308 10*3/uL (ref 150.0–400.0)
RBC: 4.86 Mil/uL (ref 3.87–5.11)
RDW: 15.2 % (ref 11.5–15.5)
WBC: 7.4 10*3/uL (ref 4.0–10.5)

## 2022-06-23 LAB — LIPID PANEL
Cholesterol: 205 mg/dL — ABNORMAL HIGH (ref 0–200)
HDL: 76.7 mg/dL (ref >39.00)
LDL Cholesterol: 114 mg/dL — ABNORMAL HIGH (ref 0–99)
NonHDL: 128.72
Total CHOL/HDL Ratio: 3
Triglycerides: 76 mg/dL (ref 0.0–149.0)
VLDL: 15.2 mg/dL (ref 0.0–40.0)

## 2022-06-23 LAB — URINALYSIS, ROUTINE W REFLEX MICROSCOPIC
Bilirubin Urine: NEGATIVE
Ketones, ur: NEGATIVE
Leukocytes,Ua: NEGATIVE
Nitrite: NEGATIVE
Specific Gravity, Urine: 1.01 (ref 1.000–1.030)
Total Protein, Urine: NEGATIVE
Urine Glucose: NEGATIVE
Urobilinogen, UA: 0.2 (ref 0.0–1.0)
pH: 6.5 (ref 5.0–8.0)

## 2022-06-23 LAB — VITAMIN B12: Vitamin B-12: 365 pg/mL (ref 211–911)

## 2022-06-23 LAB — TSH: TSH: 1.44 u[IU]/mL (ref 0.35–5.50)

## 2022-06-23 LAB — HEMOGLOBIN A1C: Hgb A1c MFr Bld: 6.4 % (ref 4.6–6.5)

## 2022-06-23 LAB — VITAMIN D 25 HYDROXY (VIT D DEFICIENCY, FRACTURES): VITD: 33.78 ng/mL (ref 30.00–100.00)

## 2022-06-23 MED ORDER — ROSUVASTATIN CALCIUM 20 MG PO TABS: 20.0000 mg | ORAL_TABLET | Freq: Every day | ORAL | 3 refills | Status: DC

## 2022-06-23 MED ORDER — MELOXICAM 15 MG PO TABS: 15.0000 mg | ORAL_TABLET | Freq: Every day | ORAL | 3 refills | Status: DC | PRN

## 2022-06-23 MED ORDER — TRIAMTERENE-HCTZ 37.5-25 MG PO CAPS
ORAL_CAPSULE | ORAL | 3 refills | Status: DC
Start: 2022-06-23 — End: 2023-06-15

## 2022-06-23 MED ORDER — OLMESARTAN MEDOXOMIL 40 MG PO TABS: 40.0000 mg | ORAL_TABLET | Freq: Every day | ORAL | 3 refills | Status: DC

## 2022-06-23 MED ORDER — GABAPENTIN 100 MG PO CAPS: 100.0000 mg | ORAL_CAPSULE | Freq: Three times a day (TID) | ORAL | 5 refills | Status: DC

## 2022-06-23 NOTE — Patient Instructions (Signed)
Please continue all other medications as before, and refills have been done if requested.  Please have the pharmacy call with any other refills you may need.  Please continue your efforts at being more active, low cholesterol diet, and weight control.  You are otherwise up to date with prevention measures today.  Please keep your appointments with your specialists as you may have planned  You will be contacted regarding the referral for: colonoscopy for Jan 2025  Please go to the LAB at the blood drawing area for the tests to be done  You will be contacted by phone if any changes need to be made immediately.  Otherwise, you will receive a letter about your results with an explanation, but please check with MyChart first.  Please remember to sign up for MyChart if you have not done so, as this will be important to you in the future with finding out test results, communicating by private email, and scheduling acute appointments online when needed.  Please make an Appointment to return for your 1 year visit, or sooner if needed

## 2022-06-23 NOTE — Progress Notes (Unsigned)
Patient ID: Jennifer Hobbs, female   DOB: Jul 09, 1950, 72 y.o.   MRN: [id number]         Chief Complaint:: wellness exam and chronic lbp, low vit d, htn, hld, hypergycemia       HPI:  Jennifer Hobbs is a 72 y.o. female here for wellness exam; due for colonoscopy, for shingrix at pharmacy, o/w up to date                        Also Pt continues to have recurring LBP without change in severity, bowel or bladder change, fever, wt loss,  worsening LE pain/numbness/weakness, gait change or falls.  Pt denies chest pain, increased sob or doe, wheezing, orthopnea, PND, increased LE swelling, palpitations, dizziness or syncope.   Pt denies polydipsia, polyuria, or new focal neuro s/s.   Pt denies fever, wt loss, night sweats, loss of appetite, or other constitutional symptoms     Wt Readings from Last 3 Encounters:  06/23/22 185 lb (83.9 kg)  01/19/22 182 lb (82.6 kg)  12/22/21 180 lb (81.6 kg)   BP Readings from Last 3 Encounters:  06/23/22 120/78  01/19/22 (!) 162/80  12/22/21 134/80   Immunization History  Administered Date(s) Administered   COVID-19, mRNA, vaccine(Comirnaty)70 years and older 12/24/2021   Fluad Quad(high Dose 65+) 12/01/2018, 12/03/2019, 12/22/2021   Influenza, High Dose Seasonal PF 11/29/2017   Influenza-Unspecified 12/05/2020   PFIZER(Purple Top)SARS-COV-2 Vaccination 04/12/2019, 05/08/2019, 12/27/2019   Pfizer Covid-19 Vaccine Bivalent Booster 31yrs & up 12/05/2020   Pneumococcal Conjugate-13 12/03/2019   Pneumococcal Polysaccharide-23 12/01/2018   Tdap 12/01/2018  There are no preventive care reminders to display for this patient.    Past Medical History:  Diagnosis Date   Allergy    DDD (degenerative disc disease), lumbar    Degenerative disc disease, lumbar 07/10/2015   Past Surgical History:  Procedure Laterality Date   UTERINE FIBROID SURGERY      reports that she quit smoking about 13 years ago. Her smoking use included cigarettes. She has never used  smokeless tobacco. She reports current alcohol use. She reports that she does not use drugs. family history includes Diabetes in her sister; Multiple sclerosis (age of onset: 70) in her daughter; Stroke in her brother. No Known Allergies Current Outpatient Medications on File Prior to Visit  Medication Sig Dispense Refill   aspirin EC 81 MG tablet Take 1 tablet (81 mg total) by mouth daily.     cyclobenzaprine (FLEXERIL) 5 MG tablet 1 tab by mouth three times per day as needed 30 tablet 3   Multiple Vitamin (MULTIVITAMIN) tablet Take 1 tablet by mouth daily.     No current facility-administered medications on file prior to visit.        ROS:  All others reviewed and negative.  Objective        PE:  BP 120/78 (BP Location: Left Arm, Patient Position: Sitting, Cuff Size: Normal)   Pulse 87   Temp 97.9 F (36.6 C) (Oral)   Ht 5\' 7"  (1.702 m)   Wt 185 lb (83.9 kg)   SpO2 99%   BMI 28.98 kg/m                 Constitutional: Pt appears in NAD               HENT: Head: NCAT.                Right Ear: External  ear normal.                 Left Ear: External ear normal.                Eyes: . Pupils are equal, round, and reactive to light. Conjunctivae and EOM are normal               Nose: without d/c or deformity               Neck: Neck supple. Gross normal ROM               Cardiovascular: Normal rate and regular rhythm.                 Pulmonary/Chest: Effort normal and breath sounds without rales or wheezing.                Abd:  Soft, NT, ND, + BS, no organomegaly               Neurological: Pt is alert. At baseline orientation, motor grossly intact               Skin: Skin is warm. No rashes, no other new lesions, LE edema - none               Psychiatric: Pt behavior is normal without agitation   Micro: none  Cardiac tracings I have personally interpreted today:  none  Pertinent Radiological findings (summarize): none   Lab Results  Component Value Date   WBC 7.4  06/23/2022   HGB 12.4 06/23/2022   HCT 38.3 06/23/2022   PLT 308.0 06/23/2022   GLUCOSE 97 06/23/2022   CHOL 205 (H) 06/23/2022   TRIG 76.0 06/23/2022   HDL 76.70 06/23/2022   LDLDIRECT 139.0 11/29/2017   LDLCALC 114 (H) 06/23/2022   ALT 16 06/23/2022   AST 24 06/23/2022   NA 141 06/23/2022   K 3.9 06/23/2022   CL 104 06/23/2022   CREATININE 1.22 (H) 06/23/2022   BUN 26 (H) 06/23/2022   CO2 26 06/23/2022   TSH 1.44 06/23/2022   HGBA1C 6.4 06/23/2022   Assessment/Plan:  Jennifer Hobbs is a 72 y.o. Black or African American [2] female with  has a past medical history of Allergy, DDD (degenerative disc disease), lumbar, and Degenerative disc disease, lumbar (07/10/2015).  Encounter for well adult exam with abnormal findings Age and sex appropriate education and counseling updated with regular exercise and diet Referrals for preventative services - for colonoscopy,  Immunizations addressed - for shingrix at pharmacy Smoking counseling  - none needed Evidence for depression or other mood disorder - none significant Most recent labs reviewed. I have personally reviewed and have noted: 1) the patient's medical and social history 2) The patient's current medications and supplements 3) The patient's height, weight, and BMI have been recorded in the chart   Vitamin D deficiency Last vitamin D Lab Results  Component Value Date   VD25OH 33.78 06/23/2022   Low, to start oral replacement   Hypertension, uncontrolled BP Readings from Last 3 Encounters:  06/23/22 120/78  01/19/22 (!) 162/80  12/22/21 134/80   Stable, pt to continue medical treatment benicar 40 qd, dyazide 1 qd   Hyperlipidemia Lab Results  Component Value Date   LDLCALC 114 (H) 06/23/2022   Uncontrolled, pt to continue current statin crestor 20 mg with better compliance, and lower chol diet, goal ldl < 100, declines increase crestor for now  Hyperglycemia Lab Results  Component Value Date   HGBA1C  6.4 06/23/2022   Stable, pt to continue current medical treatment  - diet, wt control   Chronic low back pain with bilateral sciatica Chronic stable, for gabapentin to continue  Followup: Return in about 1 year (around 06/23/2023).  Oliver Barre, MD 06/24/2022 9:26 PM [facility]

## 2022-06-24 ENCOUNTER — Encounter: Payer: Self-pay | Admitting: Internal Medicine

## 2022-06-24 NOTE — Assessment & Plan Note (Signed)
Chronic stable, for gabapentin to continue

## 2022-06-24 NOTE — Assessment & Plan Note (Signed)
BP Readings from Last 3 Encounters:  06/23/22 120/78  01/19/22 (!) 162/80  12/22/21 134/80   Stable, pt to continue medical treatment benicar 40 qd, dyazide 1 qd

## 2022-06-24 NOTE — Assessment & Plan Note (Signed)
Lab Results  Component Value Date   LDLCALC 114 (H) 06/23/2022   Uncontrolled, pt to continue current statin crestor 20 mg with better compliance, and lower chol diet, goal ldl < 100, declines increase crestor for now

## 2022-06-24 NOTE — Assessment & Plan Note (Signed)
Lab Results  Component Value Date   HGBA1C 6.4 06/23/2022   Stable, pt to continue current medical treatment  - diet, wt control

## 2022-06-24 NOTE — Assessment & Plan Note (Signed)
Age and sex appropriate education and counseling updated with regular exercise and diet Referrals for preventative services - for colonoscopy,  Immunizations addressed - for shingrix at pharmacy Smoking counseling  - none needed Evidence for depression or other mood disorder - none significant Most recent labs reviewed. I have personally reviewed and have noted: 1) the patient's medical and social history 2) The patient's current medications and supplements 3) The patient's height, weight, and BMI have been recorded in the chart

## 2022-06-24 NOTE — Assessment & Plan Note (Signed)
Last vitamin D Lab Results  Component Value Date   VD25OH 33.78 06/23/2022   Low, to start oral replacement

## 2022-11-10 ENCOUNTER — Encounter: Payer: Self-pay | Admitting: Pharmacist

## 2022-11-10 NOTE — Progress Notes (Signed)
Pharmacy Quality Measure Review  This patient is appearing on a report for being at risk of failing the adherence measure for cholesterol (statin) medications this calendar year.   Medication: Rosuvastatin 20 mg daily Last fill date: 09/23/22 for 90 day supply  Insurance report was not up to date. No action needed at this time.    Arbutus Leas, PharmD, BCPS [facility] [phone]

## 2022-12-07 DIAGNOSIS — R3121 Asymptomatic microscopic hematuria: Secondary | ICD-10-CM | POA: Diagnosis not present

## 2023-01-11 ENCOUNTER — Ambulatory Visit: Payer: PPO | Admitting: Internal Medicine

## 2023-01-17 DIAGNOSIS — R3129 Other microscopic hematuria: Secondary | ICD-10-CM | POA: Diagnosis not present

## 2023-01-17 DIAGNOSIS — D259 Leiomyoma of uterus, unspecified: Secondary | ICD-10-CM | POA: Diagnosis not present

## 2023-01-17 DIAGNOSIS — R3121 Asymptomatic microscopic hematuria: Secondary | ICD-10-CM | POA: Diagnosis not present

## 2023-01-17 DIAGNOSIS — N200 Calculus of kidney: Secondary | ICD-10-CM | POA: Diagnosis not present

## 2023-01-20 ENCOUNTER — Ambulatory Visit: Payer: PPO | Admitting: Internal Medicine

## 2023-02-01 ENCOUNTER — Ambulatory Visit: Payer: PPO | Admitting: Internal Medicine

## 2023-02-07 ENCOUNTER — Ambulatory Visit: Payer: PPO | Admitting: Internal Medicine

## 2023-02-11 DIAGNOSIS — N200 Calculus of kidney: Secondary | ICD-10-CM | POA: Diagnosis not present

## 2023-02-11 DIAGNOSIS — R3121 Asymptomatic microscopic hematuria: Secondary | ICD-10-CM | POA: Diagnosis not present

## 2023-02-11 DIAGNOSIS — N3582 Other urethral stricture, female: Secondary | ICD-10-CM | POA: Diagnosis not present

## 2023-02-11 DIAGNOSIS — N952 Postmenopausal atrophic vaginitis: Secondary | ICD-10-CM | POA: Diagnosis not present

## 2023-06-15 ENCOUNTER — Ambulatory Visit (INDEPENDENT_AMBULATORY_CARE_PROVIDER_SITE_OTHER): Admitting: Internal Medicine

## 2023-06-15 ENCOUNTER — Encounter: Payer: Self-pay | Admitting: Internal Medicine

## 2023-06-15 VITALS — BP 140/86 | HR 95 | Temp 97.7°F | Ht 67.0 in | Wt 180.0 lb

## 2023-06-15 DIAGNOSIS — Z0001 Encounter for general adult medical examination with abnormal findings: Secondary | ICD-10-CM

## 2023-06-15 DIAGNOSIS — R739 Hyperglycemia, unspecified: Secondary | ICD-10-CM

## 2023-06-15 DIAGNOSIS — M5442 Lumbago with sciatica, left side: Secondary | ICD-10-CM | POA: Diagnosis not present

## 2023-06-15 DIAGNOSIS — M5441 Lumbago with sciatica, right side: Secondary | ICD-10-CM | POA: Diagnosis not present

## 2023-06-15 DIAGNOSIS — Z860101 Personal history of adenomatous and serrated colon polyps: Secondary | ICD-10-CM | POA: Diagnosis not present

## 2023-06-15 DIAGNOSIS — I10 Essential (primary) hypertension: Secondary | ICD-10-CM | POA: Diagnosis not present

## 2023-06-15 DIAGNOSIS — E559 Vitamin D deficiency, unspecified: Secondary | ICD-10-CM

## 2023-06-15 DIAGNOSIS — G8929 Other chronic pain: Secondary | ICD-10-CM

## 2023-06-15 DIAGNOSIS — E538 Deficiency of other specified B group vitamins: Secondary | ICD-10-CM

## 2023-06-15 DIAGNOSIS — R413 Other amnesia: Secondary | ICD-10-CM | POA: Diagnosis not present

## 2023-06-15 DIAGNOSIS — E785 Hyperlipidemia, unspecified: Secondary | ICD-10-CM | POA: Diagnosis not present

## 2023-06-15 DIAGNOSIS — Z1231 Encounter for screening mammogram for malignant neoplasm of breast: Secondary | ICD-10-CM

## 2023-06-15 DIAGNOSIS — R29898 Other symptoms and signs involving the musculoskeletal system: Secondary | ICD-10-CM | POA: Diagnosis not present

## 2023-06-15 LAB — HEPATIC FUNCTION PANEL
ALT: 12 U/L (ref 0–35)
AST: 18 U/L (ref 0–37)
Albumin: 4.4 g/dL (ref 3.5–5.2)
Alkaline Phosphatase: 78 U/L (ref 39–117)
Bilirubin, Direct: 0.1 mg/dL (ref 0.0–0.3)
Total Bilirubin: 0.6 mg/dL (ref 0.2–1.2)
Total Protein: 7.2 g/dL (ref 6.0–8.3)

## 2023-06-15 LAB — CBC WITH DIFFERENTIAL/PLATELET
Basophils Absolute: 0.1 10*3/uL (ref 0.0–0.1)
Basophils Relative: 1.1 % (ref 0.0–3.0)
Eosinophils Absolute: 0.1 10*3/uL (ref 0.0–0.7)
Eosinophils Relative: 1.4 % (ref 0.0–5.0)
HCT: 42 % (ref 36.0–46.0)
Hemoglobin: 13.4 g/dL (ref 12.0–15.0)
Lymphocytes Relative: 33.1 % (ref 12.0–46.0)
Lymphs Abs: 2.7 10*3/uL (ref 0.7–4.0)
MCHC: 31.9 g/dL (ref 30.0–36.0)
MCV: 81.1 fl (ref 78.0–100.0)
Monocytes Absolute: 0.5 10*3/uL (ref 0.1–1.0)
Monocytes Relative: 6.4 % (ref 3.0–12.0)
Neutro Abs: 4.7 10*3/uL (ref 1.4–7.7)
Neutrophils Relative %: 58 % (ref 43.0–77.0)
Platelets: 327 10*3/uL (ref 150.0–400.0)
RBC: 5.18 Mil/uL — ABNORMAL HIGH (ref 3.87–5.11)
RDW: 15.8 % — ABNORMAL HIGH (ref 11.5–15.5)
WBC: 8 10*3/uL (ref 4.0–10.5)

## 2023-06-15 LAB — URINALYSIS, ROUTINE W REFLEX MICROSCOPIC
Bilirubin Urine: NEGATIVE
Ketones, ur: NEGATIVE
Leukocytes,Ua: NEGATIVE
Nitrite: NEGATIVE
Specific Gravity, Urine: 1.01 (ref 1.000–1.030)
Total Protein, Urine: NEGATIVE
Urine Glucose: NEGATIVE
Urobilinogen, UA: 0.2 (ref 0.0–1.0)
pH: 6.5 (ref 5.0–8.0)

## 2023-06-15 LAB — VITAMIN D 25 HYDROXY (VIT D DEFICIENCY, FRACTURES): VITD: 26.1 ng/mL — ABNORMAL LOW (ref 30.00–100.00)

## 2023-06-15 LAB — LIPID PANEL
Cholesterol: 243 mg/dL — ABNORMAL HIGH (ref 0–200)
HDL: 82.1 mg/dL (ref >39.00)
LDL Cholesterol: 138 mg/dL — ABNORMAL HIGH (ref 0–99)
NonHDL: 161.16
Total CHOL/HDL Ratio: 3
Triglycerides: 117 mg/dL (ref 0.0–149.0)
VLDL: 23.4 mg/dL (ref 0.0–40.0)

## 2023-06-15 LAB — BASIC METABOLIC PANEL WITH GFR
BUN: 15 mg/dL (ref 6–23)
CO2: 26 meq/L (ref 19–32)
Calcium: 10.2 mg/dL (ref 8.4–10.5)
Chloride: 104 meq/L (ref 96–112)
Creatinine, Ser: 1.09 mg/dL (ref 0.40–1.20)
GFR: 51.46 mL/min — ABNORMAL LOW (ref >60.00)
Glucose, Bld: 100 mg/dL — ABNORMAL HIGH (ref 70–99)
Potassium: 3.9 meq/L (ref 3.5–5.1)
Sodium: 143 meq/L (ref 135–145)

## 2023-06-15 LAB — MICROALBUMIN / CREATININE URINE RATIO
Creatinine,U: 62.7 mg/dL
Microalb Creat Ratio: 21 mg/g (ref 0.0–30.0)
Microalb, Ur: 1.3 mg/dL (ref 0.0–1.9)

## 2023-06-15 LAB — VITAMIN B12: Vitamin B-12: 385 pg/mL (ref 211–911)

## 2023-06-15 LAB — TSH: TSH: 1.23 u[IU]/mL (ref 0.35–5.50)

## 2023-06-15 LAB — HEMOGLOBIN A1C: Hgb A1c MFr Bld: 5.9 % (ref 4.6–6.5)

## 2023-06-15 MED ORDER — ROSUVASTATIN CALCIUM 20 MG PO TABS: 20.0000 mg | ORAL_TABLET | Freq: Every day | ORAL | 3 refills | Status: DC

## 2023-06-15 MED ORDER — TRIAMTERENE-HCTZ 37.5-25 MG PO CAPS: ORAL_CAPSULE | ORAL | 3 refills | Status: AC

## 2023-06-15 MED ORDER — OLMESARTAN MEDOXOMIL 40 MG PO TABS: 40.0000 mg | ORAL_TABLET | Freq: Every day | ORAL | 3 refills | Status: AC

## 2023-06-15 NOTE — Assessment & Plan Note (Signed)
 Age and sex appropriate education and counseling updated with regular exercise and diet Referrals for preventative services - for colonoscopy, and mammogram Immunizations addressed - for shingrix at pharmacy Smoking counseling  - none needed Evidence for depression or other mood disorder - none significant Most recent labs reviewed. I have personally reviewed and have noted: 1) the patient's medical and social history 2) The patient's current medications and supplements 3) The patient's height, weight, and BMI have been recorded in the chart

## 2023-06-15 NOTE — Addendum Note (Signed)
 Addended by: Roslyn Coombe on: 06/15/2023 08:57 PM   Modules accepted: Orders

## 2023-06-15 NOTE — Assessment & Plan Note (Signed)
 Lab Results  Component Value Date   LDLCALC 138 (H) 06/15/2023   uncontrolled, pt to restart crestor  20 mg qd

## 2023-06-15 NOTE — Progress Notes (Signed)
 Patient ID: Jennifer Hobbs, female   DOB: 05/11/1950, 73 y.o.   MRN: [id number]         Chief Complaint:: wellness exam and memory loss, right leg weakness, htn, hld, hyperglycemia, low vit d, hx of chronic lbp       HPI:  Jennifer Hobbs is a 73 y.o. female here for wellness exam; due for colonoscopy and mammogram, for shingrix at pharmacy, o/w up to date                Also Pt denies chest pain, increased sob or doe, wheezing, orthopnea, PND, increased LE swelling, palpitations, dizziness or syncope.   Pt denies polydipsia, polyuria.  Does have new neuro finding of right leg weakness without lbp, she is not sure when this occurred, has been difficult to walk and stairs for several weeks.  Also daughter with her mentions at least memory loss and asking repeated questions over the past year.     Wt Readings from Last 3 Encounters:  06/15/23 180 lb (81.6 kg)  06/23/22 185 lb (83.9 kg)  01/19/22 182 lb (82.6 kg)   BP Readings from Last 3 Encounters:  06/15/23 (!) 140/86  06/23/22 120/78  01/19/22 (!) 162/80   Immunization History  Administered Date(s) Administered   Fluad Quad(high Dose 65+) 12/01/2018, 12/03/2019, 12/22/2021   Influenza, High Dose Seasonal PF 11/29/2017   Influenza-Unspecified 12/05/2020   PFIZER(Purple Top)SARS-COV-2 Vaccination 04/12/2019, 05/08/2019, 12/27/2019   Pfizer Covid-19 Vaccine Bivalent Booster 24yrs & up 12/05/2020   Pfizer(Comirnaty)Fall Seasonal Vaccine 12 years and older 12/24/2021   Pneumococcal Conjugate-13 12/03/2019   Pneumococcal Polysaccharide-23 12/01/2018   Tdap 12/01/2018   Health Maintenance Due  Topic Date Due   Zoster Vaccines- Shingrix (1 of 2) Never done   Colonoscopy  02/24/2021   Medicare Annual Wellness (AWV)  11/21/2022   MAMMOGRAM  02/02/2023      Past Medical History:  Diagnosis Date   Allergy    DDD (degenerative disc disease), lumbar    Degenerative disc disease, lumbar 07/10/2015   Past Surgical History:  Procedure  Laterality Date   UTERINE FIBROID SURGERY      reports that she quit smoking about 14 years ago. Her smoking use included cigarettes. She has never used smokeless tobacco. She reports current alcohol use. She reports that she does not use drugs. family history includes Diabetes in her sister; Multiple sclerosis (age of onset: 55) in her daughter; Stroke in her brother. No Known Allergies Current Outpatient Medications on File Prior to Visit  Medication Sig Dispense Refill   aspirin  EC 81 MG tablet Take 1 tablet (81 mg total) by mouth daily.     cyclobenzaprine  (FLEXERIL ) 5 MG tablet 1 tab by mouth three times per day as needed 30 tablet 3   gabapentin  (NEURONTIN ) 100 MG capsule Take 1 capsule (100 mg total) by mouth 3 (three) times daily. 90 capsule 5   meloxicam  (MOBIC ) 15 MG tablet Take 1 tablet (15 mg total) by mouth daily as needed for pain. 90 tablet 3   Multiple Vitamin (MULTIVITAMIN) tablet Take 1 tablet by mouth daily.     olmesartan  (BENICAR ) 40 MG tablet Take 1 tablet (40 mg total) by mouth daily. 90 tablet 3   rosuvastatin  (CRESTOR ) 20 MG tablet Take 1 tablet (20 mg total) by mouth daily. 90 tablet 3   triamterene -hydrochlorothiazide (DYAZIDE) 37.5-25 MG capsule TAKE 1 CAPSULE BY MOUTH EVERY DAY 90 capsule 3   No current facility-administered medications on file prior  to visit.        ROS:  All others reviewed and negative.  Objective        PE:  BP (!) 140/86 (BP Location: Left Arm, Patient Position: Sitting, Cuff Size: Normal)   Pulse 95   Temp 97.7 F (36.5 C) (Oral)   Ht 5\' 7"  (1.702 m)   Wt 180 lb (81.6 kg)   SpO2 98%   BMI 28.19 kg/m                 Constitutional: Pt appears in NAD               HENT: Head: NCAT.                Right Ear: External ear normal.                 Left Ear: External ear normal.                Eyes: . Pupils are equal, round, and reactive to light. Conjunctivae and EOM are normal               Nose: without d/c or deformity                Neck: Neck supple. Gross normal ROM               Cardiovascular: Normal rate and regular rhythm.                 Pulmonary/Chest: Effort normal and breath sounds without rales or wheezing.                Abd:  Soft, NT, ND, + BS, no organomegaly               Neurological: Pt is alert. At baseline orientation, motor with Right leg 4/5 motor only               Skin: Skin is warm. No rashes, no other new lesions, LE edema - none               Psychiatric: Pt behavior is normal without agitation   Micro: none  Cardiac tracings I have personally interpreted today:  none  Pertinent Radiological findings (summarize): none   Lab Results  Component Value Date   WBC 8.0 06/15/2023   HGB 13.4 06/15/2023   HCT 42.0 06/15/2023   PLT 327.0 06/15/2023   GLUCOSE 100 (H) 06/15/2023   CHOL 243 (H) 06/15/2023   TRIG 117.0 06/15/2023   HDL 82.10 06/15/2023   LDLDIRECT 139.0 11/29/2017   LDLCALC 138 (H) 06/15/2023   ALT 12 06/15/2023   AST 18 06/15/2023   NA 143 06/15/2023   K 3.9 06/15/2023   CL 104 06/15/2023   CREATININE 1.09 06/15/2023   BUN 15 06/15/2023   CO2 26 06/15/2023   TSH 1.23 06/15/2023   HGBA1C 5.9 06/15/2023   MICROALBUR 1.3 06/15/2023   Assessment/Plan:  Jennifer Hobbs is a 73 y.o. Black or African American [2] female with  has a past medical history of Allergy, DDD (degenerative disc disease), lumbar, and Degenerative disc disease, lumbar (07/10/2015).  Chronic low back pain with bilateral sciatica Recently asympt per pt  Encounter for well adult exam with abnormal findings Age and sex appropriate education and counseling updated with regular exercise and diet Referrals for preventative services - for colonoscopy, and mammogram Immunizations addressed - for shingrix at pharmacy Smoking counseling  - none needed  Evidence for depression or other mood disorder - none significant Most recent labs reviewed. I have personally reviewed and have noted: 1) the patient's  medical and social history 2) The patient's current medications and supplements 3) The patient's height, weight, and BMI have been recorded in the chart   Hypertension, uncontrolled BP Readings from Last 3 Encounters:  06/15/23 (!) 140/86  06/23/22 120/78  01/19/22 (!) 162/80   Uncontrolled, pt to continue medical treatment benicar  40 mg every day, dyazide 1 every day as declines change today   Hyperlipidemia Lab Results  Component Value Date   LDLCALC 138 (H) 06/15/2023   uncontrolled, pt to restart crestor  20 mg qd   Hyperglycemia Lab Results  Component Value Date   HGBA1C 5.9 06/15/2023   Stable, pt to continue current medical treatment  - diet, wt control   Vitamin D  deficiency Last vitamin D  Lab Results  Component Value Date   VD25OH 26.10 (L) 06/15/2023   Low, to start oral replacement   Right leg weakness Pt without worsening lbp  - in fact seems to have improved, but has new right leg weakness, can't r/o left brain cva - for MRI  Memory loss Seems c/w MCI vs dementia, for MRI brain, and refer neurology  History of adenomatous polyp of colon First and last colonoscopy 2018 with 1 adenoma polyp - due for f/u colonoscopy  Followup: Return in about 6 months (around 12/15/2023).  Rosalia Colonel, MD 06/15/2023 8:55 PM [facility]

## 2023-06-15 NOTE — Assessment & Plan Note (Signed)
 Recently asympt per pt

## 2023-06-15 NOTE — Assessment & Plan Note (Signed)
 Last vitamin D  Lab Results  Component Value Date   VD25OH 26.10 (L) 06/15/2023   Low, to start oral replacement

## 2023-06-15 NOTE — Assessment & Plan Note (Signed)
 Seems c/w MCI vs dementia, for MRI brain, and refer neurology

## 2023-06-15 NOTE — Patient Instructions (Addendum)
 Please continue all other medications as before, and refills have been done if requested.  Please have the pharmacy call with any other refills you may need.  Please continue your efforts at being more active, low cholesterol diet, and weight control.  You are otherwise up to date with prevention measures today.  Please keep your appointments with your specialists as you may have planned  You will be contacted regarding the referral for: MRI brain, Neurology, mammogram and colonoscopy  You will be contacted by phone if any changes need to be made immediately.  Otherwise, you will receive a letter about your results with an explanation, but please check with MyChart first.  Please make an Appointment to return in 6 months, or sooner if needed

## 2023-06-15 NOTE — Assessment & Plan Note (Signed)
 Lab Results  Component Value Date   HGBA1C 5.9 06/15/2023   Stable, pt to continue current medical treatment  - diet, wt control

## 2023-06-15 NOTE — Assessment & Plan Note (Signed)
 BP Readings from Last 3 Encounters:  06/15/23 (!) 140/86  06/23/22 120/78  01/19/22 (!) 162/80   Uncontrolled, pt to continue medical treatment benicar  40 mg every day, dyazide 1 every day as declines change today

## 2023-06-15 NOTE — Assessment & Plan Note (Signed)
 Pt without worsening lbp  - in fact seems to have improved, but has new right leg weakness, can't r/o left brain cva - for MRI

## 2023-06-15 NOTE — Assessment & Plan Note (Signed)
 First and last colonoscopy 2018 with 1 adenoma polyp - due for f/u colonoscopy

## 2023-06-16 ENCOUNTER — Other Ambulatory Visit: Payer: Self-pay | Admitting: Internal Medicine

## 2023-06-16 ENCOUNTER — Encounter: Payer: Self-pay | Admitting: Internal Medicine

## 2023-06-16 MED ORDER — ROSUVASTATIN CALCIUM 40 MG PO TABS: 40.0000 mg | ORAL_TABLET | Freq: Every day | ORAL | 3 refills | Status: AC

## 2023-06-27 ENCOUNTER — Ambulatory Visit
Admission: RE | Admit: 2023-06-27 | Discharge: 2023-06-27 | Disposition: A | Discharge status: Home / Self Care | Source: Ambulatory Visit | Attending: Internal Medicine | Admitting: Internal Medicine

## 2023-06-27 DIAGNOSIS — Z1231 Encounter for screening mammogram for malignant neoplasm of breast: Secondary | ICD-10-CM

## 2023-06-30 ENCOUNTER — Ambulatory Visit: Payer: Self-pay

## 2023-07-22 ENCOUNTER — Other Ambulatory Visit: Payer: Self-pay | Admitting: Internal Medicine

## 2023-07-22 ENCOUNTER — Other Ambulatory Visit: Payer: Self-pay

## 2023-08-15 ENCOUNTER — Ambulatory Visit (INDEPENDENT_AMBULATORY_CARE_PROVIDER_SITE_OTHER)

## 2023-08-15 VITALS — Ht 67.0 in | Wt 180.0 lb

## 2023-08-15 DIAGNOSIS — K635 Polyp of colon: Secondary | ICD-10-CM | POA: Diagnosis not present

## 2023-08-15 DIAGNOSIS — Z0000 Encounter for general adult medical examination without abnormal findings: Secondary | ICD-10-CM | POA: Diagnosis not present

## 2023-08-15 DIAGNOSIS — Z0100 Encounter for examination of eyes and vision without abnormal findings: Secondary | ICD-10-CM

## 2023-08-15 NOTE — Patient Instructions (Signed)
 Jennifer Hobbs , Thank you for taking time out of your busy schedule to complete your Annual Wellness Visit with me. I enjoyed our conversation and look forward to speaking with you again next year. I, as well as your care team,  appreciate your ongoing commitment to your health goals. Please review the following plan we discussed and let me know if I can assist you in the future. Your Game plan/ To Do List    Referrals: If you haven't heard from the office you've been referred to, please reach out to them at the phone provided.  Referral to [facility] (Ophthalmologist) for an eye exam; Referral to Dr Leigh (GI) for a repeat Colonoscopy  Follow up Visits: Next Medicare AWV with our clinical staff: 08/15/2024   Have you seen your provider in the last 6 months (3 months if uncontrolled diabetes)? No Next Office Visit with your provider: 12/15/2023  Clinician Recommendations:  Aim for 30 minutes of exercise or brisk walking, 6-8 glasses of water, and 5 servings of fruits and vegetables each day. Educated and advised on getting the Shingles vaccines in 2025.      This is a list of the screening recommended for you and due dates:  Health Maintenance  Topic Date Due   Zoster (Shingles) Vaccine (1 of 2) Never done   Colon Cancer Screening  02/24/2021   Flu Shot  09/16/2023   Mammogram  06/26/2024   Medicare Annual Wellness Visit  08/14/2024   DTaP/Tdap/Td vaccine (2 - Td or Tdap) 11/30/2028   Pneumococcal Vaccine for age over 90  Completed   DEXA scan (bone density measurement)  Completed   Hepatitis C Screening  Completed   Hepatitis B Vaccine  Aged Out   HPV Vaccine  Aged Out   Meningitis B Vaccine  Aged Out   COVID-19 Vaccine  Discontinued    Advanced directives: (Copy Requested) Please bring a copy of your health care power of attorney and living will to the office to be added to your chart at your convenience. You can mail to [facility] [address]  [place], [place] [address] or email to [email or web address] Advance Care Planning is important because it:  [x]  Makes sure you receive the medical care that is consistent with your values, goals, and preferences  [x]  It provides guidance to your family and loved ones and reduces their decisional burden about whether or not they are making the right decisions based on your wishes.  Follow the link provided in your after visit summary or read over the paperwork we have mailed to you to help you started getting your Advance Directives in place. If you need assistance in completing these, please reach out to us  so that we can help you!

## 2023-08-15 NOTE — Progress Notes (Signed)
 Subjective:   Jennifer Hobbs is a 73 y.o. who presents for a Medicare Wellness preventive visit.  As a reminder, Annual Wellness Visits don't include a physical exam, and some assessments may be limited, especially if this visit is performed virtually. We may recommend an in-person follow-up visit with your provider if needed.  Visit Complete: Virtual I connected with  Jennifer Hobbs on 08/15/23 by a audio enabled telemedicine application and verified that I am speaking with the correct person using two identifiers.  Patient Location: Home  Provider Location: Office/Clinic  I discussed the limitations of evaluation and management by telemedicine. The patient expressed understanding and agreed to proceed.  Vital Signs: Because this visit was a virtual/telehealth visit, some criteria may be missing or patient reported. Any vitals not documented were not able to be obtained and vitals that have been documented are patient reported.  VideoDeclined- This patient declined Librarian, academic. Therefore the visit was completed with audio only.  Persons Participating in Visit: Patient.  AWV Questionnaire: No: Patient Medicare AWV questionnaire was not completed prior to this visit.  Cardiac Risk Factors include: advanced age (>34men, >59 women);dyslipidemia;hypertension     Objective:    Today's Vitals   08/15/23 0849  Weight: 180 lb (81.6 kg)  Height: 5' 7 (1.702 m)   Body mass index is 28.19 kg/m.     08/15/2023    8:48 AM 11/20/2021    9:59 AM 02/25/2016   10:37 AM 12/16/2015    8:25 AM 07/21/2015   10:56 AM  Advanced Directives  Does Patient Have a Medical Advance Directive? Yes No No  Yes  No   Type of Estate agent of [place];Living will   Healthcare Power of [place];Living will    Copy of Healthcare Power of Attorney in Chart? No - copy requested      Would patient like information on creating a medical advance directive?   No - Patient declined   Yes - Educational materials given      Data saved with a previous flowsheet row definition    Current Medications (verified) Outpatient Encounter Medications as of 08/15/2023  Medication Sig   aspirin  EC 81 MG tablet Take 1 tablet (81 mg total) by mouth daily.   Multiple Vitamin (MULTIVITAMIN) tablet Take 1 tablet by mouth daily.   olmesartan  (BENICAR ) 40 MG tablet Take 1 tablet (40 mg total) by mouth daily.   rosuvastatin  (CRESTOR ) 40 MG tablet Take 1 tablet (40 mg total) by mouth daily.   triamterene -hydrochlorothiazide (DYAZIDE) 37.5-25 MG capsule TAKE 1 CAPSULE BY MOUTH EVERY DAY   No facility-administered encounter medications on file as of 08/15/2023.    Allergies (verified) Patient has no known allergies.   History: Past Medical History:  Diagnosis Date   Allergy    DDD (degenerative disc disease), lumbar    Degenerative disc disease, lumbar 07/10/2015   Past Surgical History:  Procedure Laterality Date   UTERINE FIBROID SURGERY     Family History  Problem Relation Age of Onset   Diabetes Sister    Stroke Brother    Multiple sclerosis Daughter 25   Colon cancer Neg Hx    Colon polyps Neg Hx    Rectal cancer Neg Hx    Stomach cancer Neg Hx    Social History   Socioeconomic History   Marital status: Married    Spouse name: Tannie Koskela   Number of children: 1   Years of education: college  Highest education level: Not on file  Occupational History   Occupation: unemployed    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   Lives with her husband.   Their daughter and grandson moved to [place], [place] 02/[address]82.    Social Drivers of Corporate investment banker Strain: Low Risk  (08/15/2023)   Overall Financial Resource Strain (CARDIA)    Difficulty of Paying Living Expenses: Not hard at all  Food Insecurity: No Food Insecurity (08/15/2023)   Hunger Vital Sign    Worried About Running Out of Food in the Last Year: Never true    Ran Out of Food in the Last Year: Never true  Transportation Needs: No Transportation Needs (08/15/2023)   PRAPARE - Administrator, Civil Service (Medical): No    Lack of Transportation (Non-Medical): No  Physical Activity: Insufficiently Active (08/15/2023)   Exercise Vital Sign    Days of Exercise per Week: 3 days    Minutes of Exercise per Session: 30 min  Stress: No Stress Concern Present (08/15/2023)   [employer] of Occupational Health - Occupational Stress Questionnaire    Feeling of Stress: Not at all  Social Connections: Moderately Isolated (08/15/2023)   Social Connection and Isolation Panel    Frequency of Communication with Friends and Family: More than three times a week    Frequency of Social Gatherings with Friends and Family: More than three times a week    Attends Religious Services: Never    Database administrator or Organizations: No    Attends Engineer, structural: Never    Marital Status: Married    Tobacco Counseling Counseling given: No Tobacco comments: cannot remember when she quit smoking    Clinical Intake:  Pre-visit preparation completed: Yes  Pain : No/denies pain     BMI - recorded: 28.19 Nutritional Status: BMI 25 -29  Overweight Nutritional Risks: None Diabetes: No  Lab Results  Component Value Date   HGBA1C 5.9 06/15/2023   HGBA1C 6.4 06/23/2022   HGBA1C 6.3 12/22/2021     How often do you need to have someone help you when you read instructions, pamphlets, or other written materials from your doctor or pharmacy?: 1 - Never  Interpreter Needed?: No  Information entered by :: Verdie Saba, CMA   Activities of Daily Living     08/15/2023    8:54 AM  In your present state of health, do you have any difficulty performing the following activities:  Hearing? 0  Vision? 0  Difficulty concentrating or making decisions? 0  Walking or climbing stairs? 0  Dressing or bathing? 0  Doing errands, shopping? 0  Preparing Food and eating ? N  Using the Toilet? N  In the past six months, have you accidently leaked urine? Y  Comment wears a pad  Do you have problems with loss of bowel control? N  Managing your Medications? N  Managing your Finances? N  Housekeeping or managing your Housekeeping? N    Patient Care Team: Norleen Lynwood ORN, MD as PCP - General (Internal Medicine) Margaret Eduard SAUNDERS, MD as Consulting Physician (Neurology) Regenia, Prentice Clack, MD as Consulting Physician (Ophthalmology) Armbruster, Elspeth SQUIBB, MD as Consulting Physician (Gastroenterology)  I have updated your Care Teams any recent Medical Services you may have received from other providers in the past year.     Assessment:   This is a routine wellness examination for Jennifer Hobbs.  Hearing/Vision screen Hearing Screening - Comments:: Denies hearing difficulties   Vision Screening - Comments:: Wears rx glasses - plans to schedule an appt w/Dr Regenia   Goals Addressed               This Visit's Progress     Patient Stated (pt-stated)        Patient stated she plans to continue to walk more and watch her diet       Depression Screen     08/15/2023    8:58 AM 06/15/2023    1:44 PM 06/23/2022   10:20 AM 12/22/2021    10:13 AM 11/20/2021    9:58 AM 06/11/2021   11:28 AM 06/11/2021   11:10 AM  PHQ 2/9 Scores  PHQ - 2 Score 0 0 0 0 0 1 0  PHQ- 9 Score 0   1   2    Fall Risk     08/15/2023    8:57 AM 06/15/2023    1:57 PM 06/23/2022   10:20 AM 12/22/2021   10:14 AM 11/20/2021    9:55 AM  Fall Risk   Falls in the past year? 0 1 0 1 0  Number falls in past yr: 0 1 0 1 0  Injury with Fall? 0 0 0 0 0  Risk for fall due to : No Fall Risks;History of fall(s) History of fall(s) No Fall Risks Impaired balance/gait No Fall Risks  Follow up Falls evaluation completed;Falls prevention discussed Falls evaluation completed Falls evaluation completed Falls evaluation completed  Falls prevention discussed      Data saved with a previous flowsheet row definition    MEDICARE RISK AT HOME:  Medicare Risk at Home Any stairs in or around the home?: No If so, are there any without handrails?: No Home free of loose throw rugs in walkways, pet beds, electrical cords, etc?: Yes Adequate lighting in your home to reduce risk of falls?: Yes Life alert?: No Use of a cane, walker or w/c?: Yes (cane/walker) Grab bars in the bathroom?: Yes Shower chair or bench in shower?: Yes Elevated toilet seat or a handicapped toilet?: Yes  TIMED UP AND GO:  Was the test performed?  No  Cognitive Function: 6CIT completed        08/15/2023    9:02 AM 11/20/2021   10:00 AM  6CIT Screen  What Year? 0 points 0 points  What month? 0 points 0 points  What time? 3 points 0 points  Count back from 20 0 points 0 points  Months in reverse 4 points 0 points  Repeat phrase 0 points 0 points  Total Score 7 points 0 points    Immunizations Immunization History  Administered Date(s)  Administered   Fluad Quad(high Dose 65+) 12/01/2018, 12/03/2019, 12/22/2021   Influenza, High Dose Seasonal PF 11/29/2017   Influenza-Unspecified 12/05/2020   PFIZER(Purple Top)SARS-COV-2 Vaccination 04/12/2019, 05/08/2019, 12/27/2019   Pfizer Covid-19  Vaccine Bivalent Booster 70yrs & up 12/05/2020   Pfizer(Comirnaty)Fall Seasonal Vaccine 12 years and older 12/24/2021   Pneumococcal Conjugate-13 12/03/2019   Pneumococcal Polysaccharide-23 12/01/2018   Tdap 12/01/2018    Screening Tests Health Maintenance  Topic Date Due   Zoster Vaccines- Shingrix (1 of 2) Never done   Colonoscopy  02/24/2021   INFLUENZA VACCINE  09/16/2023   MAMMOGRAM  06/26/2024   Medicare Annual Wellness (AWV)  08/14/2024   DTaP/Tdap/Td (2 - Td or Tdap) 11/30/2028   Pneumococcal Vaccine: 50+ Years  Completed   DEXA SCAN  Completed   Hepatitis C Screening  Completed   Hepatitis B Vaccines  Aged Out   HPV VACCINES  Aged Out   Meningococcal B Vaccine  Aged Out   COVID-19 Vaccine  Discontinued    Health Maintenance  Health Maintenance Due  Topic Date Due   Zoster Vaccines- Shingrix (1 of 2) Never done   Colonoscopy  02/24/2021   Health Maintenance Items Addressed:  Referral sent to GI for colonoscopy - Dr Leigh (h/o colon polyps)  Referral to Dr Regenia for an eye exam (>52yrs).  Additional Screening:  Vision Screening: Recommended annual ophthalmology exams for early detection of glaucoma and other disorders of the eye. Would you like a referral to an eye doctor? Yes  Referral to Prentice Regenia for an eye exam.  Dental Screening: Recommended annual dental exams for proper oral hygiene  Community Resource Referral / Chronic Care Management: CRR required this visit?  No   CCM required this visit?  No   Plan:    I have personally reviewed and noted the following in the patient's chart:   Medical and social history Use of alcohol, tobacco or illicit drugs  Current medications and supplements including opioid prescriptions. Patient is not currently taking opioid prescriptions. Functional ability and status Nutritional status Physical activity Advanced directives List of other physicians Hospitalizations, surgeries, and ER visits in  previous 12 months Vitals Screenings to include cognitive, depression, and falls Referrals and appointments  In addition, I have reviewed and discussed with patient certain preventive protocols, quality metrics, and best practice recommendations. A written personalized care plan for preventive services as well as general preventive health recommendations were provided to patient.   Verdie CHRISTELLA Saba, CMA   08/15/2023   After Visit Summary: (Declined) Due to this being a telephonic visit, with patients personalized plan was offered to patient but patient Declined AVS at this time   Notes: See Routing Notes - Poor Cognitive test results

## 2023-09-30 ENCOUNTER — Other Ambulatory Visit: Payer: Self-pay | Admitting: Internal Medicine

## 2023-09-30 ENCOUNTER — Telehealth: Payer: Self-pay | Admitting: Internal Medicine

## 2023-09-30 MED ORDER — GABAPENTIN 100 MG PO CAPS: 100.0000 mg | ORAL_CAPSULE | Freq: Three times a day (TID) | ORAL | 3 refills | Status: AC

## 2023-09-30 NOTE — Telephone Encounter (Signed)
 Ok this was sent to pharmacy

## 2023-09-30 NOTE — Telephone Encounter (Signed)
 Not on current med list, last fill 06/23/22

## 2023-09-30 NOTE — Telephone Encounter (Unsigned)
 Copied from CRM #[id number]. Topic: Clinical - Medication Refill >> Sep 30, 2023 11:31 AM Jayma L wrote: Medication: gabapentin  100mg s   Has the patient contacted their pharmacy? Yes (Agent: If no, request that the patient contact the pharmacy for the refill. If patient does not wish to contact the pharmacy document the reason why and proceed with request.) (Agent: If yes, when and what did the pharmacy advise?)  This is the patient's preferred pharmacy:  [id number] [place], [place] - [address] AT [facility] [address] [place] [address] Phone: [phone] Fax: [phone]  Is this the correct pharmacy for this prescription? Yes If no, delete pharmacy and type the correct one.   Has the prescription been filled recently? No  Is the patient out of the medication? Yes  Has the patient been seen for an appointment in the last year OR does the patient have an upcoming appointment? Yes  Can we respond through MyChart? No  Agent: Please be advised that Rx refills may take up to 3 business days. We ask that you follow-up with your pharmacy.

## 2023-12-15 ENCOUNTER — Ambulatory Visit: Admitting: Internal Medicine

## 2023-12-19 ENCOUNTER — Ambulatory Visit: Admitting: Internal Medicine

## 2024-01-25 ENCOUNTER — Encounter: Payer: Self-pay | Admitting: Pharmacist

## 2024-01-25 NOTE — Progress Notes (Signed)
 Pharmacy Quality Measure Review  This patient is appearing on a report for being at risk of failing the adherence measure for cholesterol (statin) medications this calendar year.   Medication: Rosuvastatin  Last fill date: 12/22/23 for 90 day supply  Insurance report was not up to date. No action needed at this time.   Darrelyn Drum, PharmD, BCPS, CPP Clinical Pharmacist Practitioner [facility] [phone]

## 2024-03-05 ENCOUNTER — Telehealth: Payer: Self-pay | Admitting: Internal Medicine

## 2024-03-05 NOTE — Telephone Encounter (Signed)
 LMOM for husband regarding asking him to contact us  regarding the need for Death Certificate information

## 2024-03-05 NOTE — Telephone Encounter (Signed)
 LMOM to husband Cell phone Jennifer Hobbs, to ask for him to call so we can speak regarding details of Jennifer Hobbs passing away Jan 16.   No answer as well Daughter Jennifer Hobbs. Will continue to try later.

## 2024-03-06 ENCOUNTER — Telehealth: Payer: Self-pay | Admitting: Internal Medicine

## 2024-03-06 NOTE — Telephone Encounter (Signed)
 Spoke to both Mr Jennifer Hobbs and daughter Jennifer Hobbs by phone at home;  They confirmed Ms Jennifer Hobbs passed Jan 16 at home, seen per EMS, pronounced deceased at 106 PM per husband.   No foul play, no obvious cause  Since no cause is known, pt family is considering private autopsy to determine exact cause, but not certain about this yet.

## 2024-03-06 NOTE — Telephone Encounter (Signed)
 LMOM for Mr Jennifer Hobbs as well as daughter [name], asking for call back regarding [name] recent passing.

## 2024-03-08 ENCOUNTER — Encounter: Payer: Self-pay | Admitting: *Deleted

## 2024-03-08 NOTE — Progress Notes (Signed)
 Jennifer Hobbs                                          MRN: [id number]   03/08/2024   The [facility] Quality Team Specialist reviewed this patient medical record for the purposes of chart review for care gap closure. The following were reviewed: chart review for care gap closure-controlling blood pressure.    [facility] Quality Team

## 2024-03-18 DEATH — deceased

## 2024-08-15 ENCOUNTER — Ambulatory Visit
